# Patient Record
Sex: Female | Born: 1980 | Race: White | Hispanic: No | Marital: Married | Smoking: Never smoker
Health system: Southern US, Community
[De-identification: ages and names within clinical notes are randomized; demographics above are authoritative.]

## PROBLEM LIST (undated history)

## (undated) DIAGNOSIS — G43909 Migraine, unspecified, not intractable, without status migrainosus: Secondary | ICD-10-CM

## (undated) HISTORY — PX: OTHER SURGICAL HISTORY: SHX169

## (undated) HISTORY — PX: ABDOMINAL HYSTERECTOMY: SHX81

## (undated) HISTORY — PX: APPENDECTOMY: SHX54

## (undated) HISTORY — PX: OVARIAN CYST REMOVAL: SHX89

---

## 2008-05-04 ENCOUNTER — Emergency Department (HOSPITAL_BASED_OUTPATIENT_CLINIC_OR_DEPARTMENT_OTHER): Admission: EM | Admit: 2008-05-04 | Discharge: 2008-05-04 | Payer: Self-pay | Admitting: Emergency Medicine

## 2008-05-09 ENCOUNTER — Ambulatory Visit (HOSPITAL_BASED_OUTPATIENT_CLINIC_OR_DEPARTMENT_OTHER): Admission: RE | Admit: 2008-05-09 | Discharge: 2008-05-09 | Payer: Self-pay | Admitting: Family Medicine

## 2008-05-09 ENCOUNTER — Ambulatory Visit: Payer: Self-pay | Admitting: Diagnostic Radiology

## 2011-02-20 LAB — WET PREP, GENITAL: Trich, Wet Prep: NONE SEEN

## 2011-02-20 LAB — URINALYSIS, ROUTINE W REFLEX MICROSCOPIC
Bilirubin Urine: NEGATIVE
Hgb urine dipstick: NEGATIVE
Ketones, ur: NEGATIVE mg/dL
Specific Gravity, Urine: 1.023 (ref 1.005–1.030)
Urobilinogen, UA: 0.2 mg/dL (ref 0.0–1.0)

## 2011-02-20 LAB — PREGNANCY, URINE: Preg Test, Ur: NEGATIVE

## 2011-02-20 LAB — HCG, QUANTITATIVE, PREGNANCY: hCG, Beta Chain, Quant, S: <2 m[IU]/mL (ref <5)

## 2011-02-20 LAB — GC/CHLAMYDIA PROBE AMP, GENITAL
Chlamydia, DNA Probe: NEGATIVE
GC Probe Amp, Genital: NEGATIVE

## 2011-07-18 ENCOUNTER — Emergency Department (HOSPITAL_BASED_OUTPATIENT_CLINIC_OR_DEPARTMENT_OTHER)
Admission: EM | Admit: 2011-07-18 | Discharge: 2011-07-18 | Disposition: A | Discharge status: Home / Self Care | Payer: Commercial Managed Care - PPO | Attending: Emergency Medicine | Admitting: Emergency Medicine

## 2011-07-18 ENCOUNTER — Encounter (HOSPITAL_BASED_OUTPATIENT_CLINIC_OR_DEPARTMENT_OTHER): Payer: Self-pay | Admitting: *Deleted

## 2011-07-18 ENCOUNTER — Emergency Department (INDEPENDENT_AMBULATORY_CARE_PROVIDER_SITE_OTHER): Payer: Commercial Managed Care - PPO

## 2011-07-18 DIAGNOSIS — B9789 Other viral agents as the cause of diseases classified elsewhere: Secondary | ICD-10-CM | POA: Insufficient documentation

## 2011-07-18 DIAGNOSIS — R509 Fever, unspecified: Secondary | ICD-10-CM | POA: Insufficient documentation

## 2011-07-18 DIAGNOSIS — R1110 Vomiting, unspecified: Secondary | ICD-10-CM | POA: Insufficient documentation

## 2011-07-18 DIAGNOSIS — B349 Viral infection, unspecified: Secondary | ICD-10-CM

## 2011-07-18 DIAGNOSIS — R059 Cough, unspecified: Secondary | ICD-10-CM

## 2011-07-18 DIAGNOSIS — R05 Cough: Secondary | ICD-10-CM | POA: Insufficient documentation

## 2011-07-18 DIAGNOSIS — M791 Myalgia, unspecified site: Secondary | ICD-10-CM

## 2011-07-18 DIAGNOSIS — R197 Diarrhea, unspecified: Secondary | ICD-10-CM | POA: Insufficient documentation

## 2011-07-18 DIAGNOSIS — IMO0001 Reserved for inherently not codable concepts without codable children: Secondary | ICD-10-CM | POA: Insufficient documentation

## 2011-07-18 LAB — BASIC METABOLIC PANEL
BUN: 12 mg/dL (ref 6–23)
Creatinine, Ser: 1 mg/dL (ref 0.50–1.10)
GFR calc Af Amer: 85 mL/min — ABNORMAL LOW (ref >90)
GFR calc non Af Amer: 74 mL/min — ABNORMAL LOW (ref >90)
Potassium: 3.6 mEq/L (ref 3.5–5.1)

## 2011-07-18 LAB — URINALYSIS, ROUTINE W REFLEX MICROSCOPIC
Bilirubin Urine: NEGATIVE
Ketones, ur: NEGATIVE mg/dL
Leukocytes, UA: NEGATIVE
Nitrite: NEGATIVE
Specific Gravity, Urine: 1.018 (ref 1.005–1.030)
Urobilinogen, UA: 0.2 mg/dL (ref 0.0–1.0)

## 2011-07-18 MED ORDER — SODIUM CHLORIDE 0.9 % IV BOLUS (SEPSIS)
1000.0000 mL | Freq: Once | INTRAVENOUS | Status: AC
  Administered 2011-07-18: 1000 mL via INTRAVENOUS

## 2011-07-18 NOTE — ED Notes (Signed)
Patient transported to X-ray 

## 2011-07-18 NOTE — Discharge Instructions (Signed)
Antibiotic Nonuse  Your caregiver felt that the infection or problem was not one that would be helped with an antibiotic. Infections may be caused by viruses or bacteria. Only a caregiver can tell which one of these is the likely cause of an illness. A cold is the most common cause of infection in both adults and children. A cold is a virus. Antibiotic treatment will have no effect on a viral infection. Viruses can lead to many lost days of work caring for sick children and many missed days of school. Children may catch as many as 10 "colds" or "flus" per year during which they can be tearful, cranky, and uncomfortable. The goal of treating a virus is aimed at keeping the ill person comfortable. Antibiotics are medications used to help the body fight bacterial infections. There are relatively few types of bacteria that cause infections but there are hundreds of viruses. While both viruses and bacteria cause infection they are very different types of germs. A viral infection will typically go away by itself within 7 to 10 days. Bacterial infections may spread or get worse without antibiotic treatment. Examples of bacterial infections are:  Sore throats (like strep throat or tonsillitis).   Infection in the lung (pneumonia).   Ear and skin infections.  Examples of viral infections are:  Colds or flus.   Most coughs and bronchitis.   Sore throats not caused by Strep.   Runny noses.  It is often best not to take an antibiotic when a viral infection is the cause of the problem. Antibiotics can kill off the helpful bacteria that we have inside our body and allow harmful bacteria to start growing. Antibiotics can cause side effects such as allergies, nausea, and diarrhea without helping to improve the symptoms of the viral infection. Additionally, repeated uses of antibiotics can cause bacteria inside of our body to become resistant. That resistance can be passed onto harmful bacterial. The next time  you have an infection it may be harder to treat if antibiotics are used when they are not needed. Not treating with antibiotics allows our own immune system to develop and take care of infections more efficiently. Also, antibiotics will work better for [place] when they are prescribed for bacterial infections. Treatments for a child that is ill may include:  Give extra fluids throughout the day to stay hydrated.   Get plenty of rest.   Only give your child over-the-counter or prescription medicines for pain, discomfort, or fever as directed by your caregiver.   The use of a cool mist humidifier may help stuffy noses.   Cold medications if suggested by your caregiver.  Your caregiver may decide to start you on an antibiotic if:  The problem you were seen for today continues for a longer length of time than expected.   You develop a secondary bacterial infection.  SEEK MEDICAL CARE IF:  Fever lasts longer than 5 days.   Symptoms continue to get worse after 5 to 7 days or become severe.   Difficulty in breathing develops.   Signs of dehydration develop (poor drinking, rare urinating, dark colored urine).   Changes in behavior or worsening tiredness (listlessness or lethargy).  Document Released: 07/13/2001 Document Revised: 01/14/2011 Document Reviewed: 01/09/2009 [facility] Patient Information 2012 [place], [place].Myalgia, Adult Myalgia is the medical term for muscle pain. It is a symptom of many things. Nearly everyone at some time in their life has this. The most common cause for muscle pain is overuse or straining  and more so when you are not in shape. Injuries and muscle bruises cause myalgias. Muscle pain without a history of injury can also be caused by a virus. It frequently comes along with the flu. Myalgia not caused by muscle strain can be present in a large number of infectious diseases. Some autoimmune diseases like lupus and fibromyalgia can cause muscle pain. Myalgia may be mild,  or severe. SYMPTOMS  The symptoms of myalgia are simply muscle pain. Most of the time this is short lived and the pain goes away without treatment. DIAGNOSIS  Myalgia is diagnosed by your caregiver by taking your history. This means you tell him when the problems began, what they are, and what has been happening. If this has not been a long term problem, your caregiver may want to watch for a while to see what will happen. If it has been long term, they may want to do additional testing. TREATMENT  The treatment depends on what the underlying cause of the muscle pain is. Often anti-inflammatory medications will help. HOME CARE INSTRUCTIONS  If the pain in your muscles came from overuse, slow down your activities until the problems go away.   Myalgia from overuse of a muscle can be treated with alternating hot and cold packs on the muscle affected or with cold for the first couple days. If either heat or cold seems to make things worse, stop their use.   Apply ice to the sore area for 15 to 20 minutes, 3 to 4 times per day, while awake for the first 2 days of muscle soreness, or as directed. Put the ice in a plastic bag and place a towel between the bag of ice and your skin.   Only take over-the-counter or prescription medicines for pain, discomfort, or fever as directed by your caregiver.   Regular gentle exercise may help if you are not active.   Stretching before strenuous exercise can help lower the risk of myalgia. It is normal when beginning an exercise regimen to feel some muscle pain after exercising. Muscles that have not been used frequently will be sore at first. If the pain is extreme, this may mean injury to a muscle.  SEEK MEDICAL CARE IF:  You have an increase in muscle pain that is not relieved with medication.   You begin to run a temperature.   You develop nausea and vomiting.   You develop a stiff and painful neck.   You develop a rash.   You develop muscle pain  after a tick bite.   You have continued muscle pain while working out even after you are in good condition.  SEEK IMMEDIATE MEDICAL CARE IF: Any of your problems are getting worse and medications are not helping. MAKE SURE YOU:   Understand these instructions.   Will watch your condition.   Will get help right away if you are not doing well or get worse.  Document Released: 03/26/2006 Document Revised: 01/14/2011 Document Reviewed: 06/15/2006 [facility] Patient Information 2012 [place], [place].

## 2011-07-18 NOTE — ED Notes (Signed)
D/c home by self- reports she "feels better"- no new rx given

## 2011-07-18 NOTE — ED Notes (Signed)
Pt presents to ED today with muscle pain and cramping for the last 2 weeks.  Pt has recent dx of bronchitis.

## 2011-07-18 NOTE — ED Provider Notes (Signed)
Medical screening examination/treatment/procedure(s) were performed by non-physician practitioner and as supervising physician I was immediately available for consultation/collaboration.   Dayton Bailiff, MD 07/18/11 2229

## 2011-07-18 NOTE — ED Provider Notes (Signed)
History     CSN: [id number]  Arrival date & time 07/18/11  2015   First MD Initiated Contact with Patient 07/18/11 2038      Chief Complaint  Patient presents with  . Muscle Pain    (Consider location/radiation/quality/duration/timing/severity/associated sxs/prior treatment) HPI Comments: Pt states that she was was recently diagnosed with bronchitis and was put on zpack and then amoxicillin:pt states that she has had intermittent vomiting and diarrhea:pt states that she has also been having myalgias  Patient is a 31 y.o. female presenting with musculoskeletal pain. The history is provided by the patient. No language interpreter was used.  Muscle Pain This is a new problem. The current episode started 1 to 4 weeks ago. The problem occurs constantly. The problem has been unchanged. Associated symptoms include coughing and a fever. Pertinent negatives include no abdominal pain, numbness, rash or sore throat. The symptoms are aggravated by nothing. She has tried nothing for the symptoms.    History reviewed. No pertinent past medical history.  History reviewed. No pertinent past surgical history.  History reviewed. No pertinent family history.  History  Substance Use Topics  . Smoking status: Not on file  . Smokeless tobacco: Not on file  . Alcohol Use: Not on file    OB History    Grav Para Term Preterm Abortions TAB SAB Ect Mult Living                  Review of Systems  Constitutional: Positive for fever.  HENT: Negative for sore throat.   Respiratory: Positive for cough.   Gastrointestinal: Negative for abdominal pain.  Skin: Negative for rash.  Neurological: Negative for numbness.  All other systems reviewed and are negative.    Allergies  Compazine; Demerol; Dilaudid; and Morphine and related  Home Medications   Current Outpatient Rx  Name Route Sig Dispense Refill  . FAMOTIDINE-CA CARB-MAG HYDROX 10-800-165 MG PO CHEW Oral Chew 1 tablet by mouth 2 (two)  times daily.    Marland Kitchen LEVONORGEST-ETH ESTRAD 91-DAY 0.15-0.03 MG PO TABS Oral Take 1 tablet by mouth daily.    . ADULT MULTIVITAMIN W/MINERALS CH Oral Take 1 tablet by mouth daily.      BP 142/81  Pulse 82  Temp(Src) 98.2 F (36.8 C) (Oral)  Resp 20  Ht 5\' 8"  (1.727 m)  Wt 164 lb (74.39 kg)  BMI 24.94 kg/m2  SpO2 100%  Physical Exam  Nursing note and vitals reviewed. Constitutional: She is oriented to person, place, and time. She appears well-developed and well-nourished.  HENT:  Head: Normocephalic and atraumatic.  Right Ear: External ear normal.  Left Ear: External ear normal.  Nose: Nose normal.  Eyes: EOM are normal.  Neck: Neck supple.  Cardiovascular: Normal rate and regular rhythm.   Pulmonary/Chest: Effort normal and breath sounds normal. She exhibits no tenderness.  Abdominal: Soft. Bowel sounds are normal. There is no tenderness.  Musculoskeletal: Normal range of motion.  Neurological: She is alert and oriented to person, place, and time.  Skin: Skin is warm and dry.  Psychiatric: She has a normal mood and affect.    ED Course  Procedures (including critical care time)  Labs Reviewed  BASIC METABOLIC PANEL - Abnormal; Notable for the following:    GFR calc non Af Amer 74 (*)    GFR calc Af Amer 85 (*)    All other components within normal limits  URINALYSIS, ROUTINE W REFLEX MICROSCOPIC  PREGNANCY, URINE   Dg Chest 2  View  07/18/2011  *RADIOLOGY REPORT*  Clinical Data: Cough  CHEST - 2 VIEW  Comparison: None  Findings:  There is a scoliosis deformity involving the thoracic spine which is convex to the right.  The heart size appears normal.  No pleural effusion or pulmonary edema.  No airspace consolidation.  IMPRESSION:  1.  No active cardiopulmonary abnormalities.  Original Report Authenticated By: Rosealee Albee, M.D.     1. Myalgia   2. Viral illness       MDM  Pt feeling some what better after fluids:pt is okay to follow up with pcp as needed for  continued symptoms       Teressa Lower, NP 07/18/11 2227

## 2011-11-12 ENCOUNTER — Emergency Department (HOSPITAL_BASED_OUTPATIENT_CLINIC_OR_DEPARTMENT_OTHER): Payer: Commercial Managed Care - PPO

## 2011-11-12 ENCOUNTER — Encounter (HOSPITAL_BASED_OUTPATIENT_CLINIC_OR_DEPARTMENT_OTHER): Payer: Self-pay | Admitting: Emergency Medicine

## 2011-11-12 ENCOUNTER — Emergency Department (HOSPITAL_BASED_OUTPATIENT_CLINIC_OR_DEPARTMENT_OTHER)
Admission: EM | Admit: 2011-11-12 | Discharge: 2011-11-13 | Disposition: A | Discharge status: Home / Self Care | Payer: Commercial Managed Care - PPO | Attending: Emergency Medicine | Admitting: Emergency Medicine

## 2011-11-12 DIAGNOSIS — H538 Other visual disturbances: Secondary | ICD-10-CM | POA: Insufficient documentation

## 2011-11-12 DIAGNOSIS — R51 Headache: Secondary | ICD-10-CM | POA: Insufficient documentation

## 2011-11-12 HISTORY — DX: Migraine, unspecified, not intractable, without status migrainosus: G43.909

## 2011-11-12 NOTE — ED Provider Notes (Signed)
History     CSN: [id number]  Arrival date & time 11/12/11  2217   First MD Initiated Contact with Patient 11/12/11 2317      Chief Complaint  Patient presents with  . Headache    (Consider location/radiation/quality/duration/timing/severity/associated sxs/prior treatment) HPI This is a 31 year old white female with a two-month history of headaches. She describes the headaches as "being zapped" at various spots on her head, right greater the left. These "zaps" only last a second or 2 and resolve on their own. They're accompanied by blurred vision which also resolves when these pains resolve. The pains are not severe but have increased in frequency over the past several days. They are not accompanied by any focal neurologic deficit other than blurred vision. There is no associated nausea or vomiting. They are not like previous migraine headaches she has had. She saw her PCP about a month ago and was given a shot of Toradol which she states helped somewhat for about 2 days. She has subsequently been taking ibuprofen without relief. She has not had any imaging studies in years.  Past Medical History  Diagnosis Date  . Migraine     when she was younger    History reviewed. No pertinent past surgical history.  History reviewed. No pertinent family history.  History  Substance Use Topics  . Smoking status: Never Smoker   . Smokeless tobacco: Not on file  . Alcohol Use: No    OB History    Grav Para Term Preterm Abortions TAB SAB Ect Mult Living                  Review of Systems  All other systems reviewed and are negative.    Allergies  Compazine; Demerol; Dilaudid; and Morphine and related  Home Medications   Current Outpatient Rx  Name Route Sig Dispense Refill  . ALPRAZOLAM 0.25 MG PO TABS Oral Take 0.25 mg by mouth at bedtime as needed. Patient uses this medication for anxiety.    . ADULT MULTIVITAMIN W/MINERALS CH Oral Take 1 tablet by mouth daily.    Marland Kitchen  FAMOTIDINE-CA CARB-MAG HYDROX 10-800-165 MG PO CHEW Oral Chew 1 tablet by mouth 2 (two) times daily.    Marland Kitchen LEVONORGEST-ETH ESTRAD 91-DAY 0.15-0.03 MG PO TABS Oral Take 1 tablet by mouth daily.      BP 130/79  Pulse 71  Temp 98.6 F (37 C) (Oral)  Resp 18  SpO2 100%  LMP 10/29/2011  Physical Exam General: Well-developed, well-nourished female in no acute distress; appearance consistent with age of record HENT: normocephalic, atraumatic Eyes: pupils equal round and reactive to light; extraocular muscles intact Neck: supple Heart: regular rate and rhythm Lungs: clear to auscultation bilaterally Abdomen: soft; nondistended Extremities: No deformity; full range of motion Neurologic: Awake, alert and oriented; motor function intact in all extremities and symmetric; no facial droop; normal coordination speech; negative Romberg; normal finger to nose Skin: Warm and dry Psychiatric: Normal mood and affect    ED Course  Procedures (including critical care time)     MDM   Nursing notes and vitals signs, including pulse oximetry, reviewed.  Summary of this visit's results, reviewed by myself:   Imaging Studies: Ct Head Wo Contrast  11/12/2011  *RADIOLOGY REPORT*  Clinical Data: 31 year old female with severe headache.  CT HEAD WITHOUT CONTRAST  Technique:  Contiguous axial images were obtained from the base of the skull through the vertex without contrast.  Comparison: None.  Findings: Visualized paranasal sinuses and  mastoids are clear.  No acute osseous abnormality identified.  Visualized orbits and scalp soft tissues are within normal limits.  Cerebral volume is within normal limits for age.  No midline shift, ventriculomegaly, mass effect, evidence of mass lesion, intracranial hemorrhage or evidence of cortically based acute infarction.  Gray-white matter differentiation is within normal limits throughout the brain.  No suspicious intracranial vascular hyperdensity.  IMPRESSION:  Normal noncontrast CT appearance of the brain.  Original Report Authenticated By: Harley Hallmark, M.D.   12:20 AM Patient advised of CT findings. Patient's symptoms are atypical. Will refer to neurology for further workup         Hanley Seamen, MD 11/13/11 0020

## 2011-11-12 NOTE — ED Notes (Signed)
Pt reports feeling like she is gettting "zapped" in different parts of here head she reports sharp pain to different parts of her head, reports blurred vision at those times that immediately goes away

## 2011-11-13 NOTE — Discharge Instructions (Signed)

## 2013-06-18 IMAGING — CT CT HEAD W/O CM
1 series · 16 of 30 positions shown, 20 images · non-contrast
Comparison: None.

CLINICAL DATA: 31-year-old female with severe headache.

CT HEAD WITHOUT CONTRAST
TECHNIQUE: Contiguous axial images were obtained from the base of
the skull through the vertex without contrast.

[Series 2: head 4.8 h37s · axial · 0.45mm/px · z∈[-215,-82]mm · 16 of 32 slices shown, 20 images]
[im 2/32  brain]
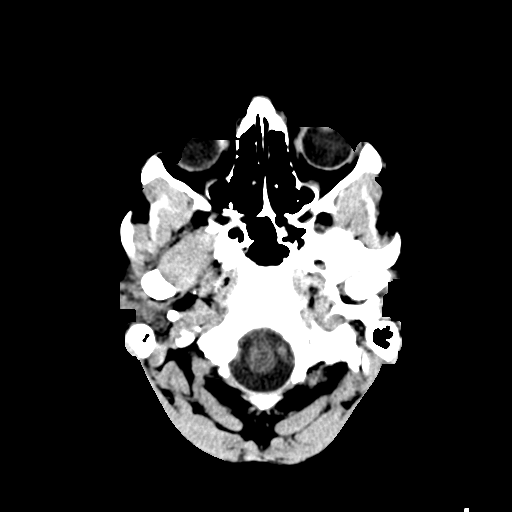
[im 2/32  bone]
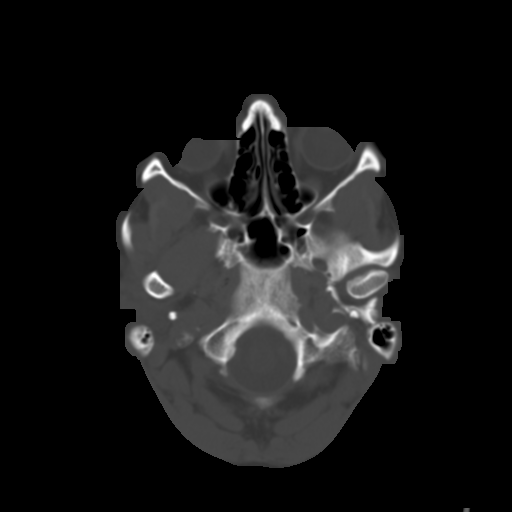
[im 4/32  brain]
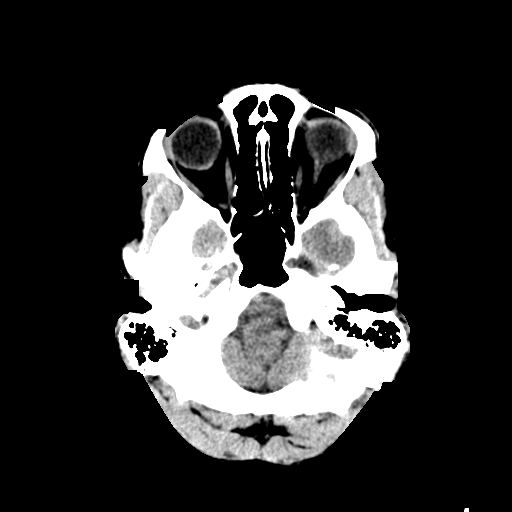
[im 6/32  brain]
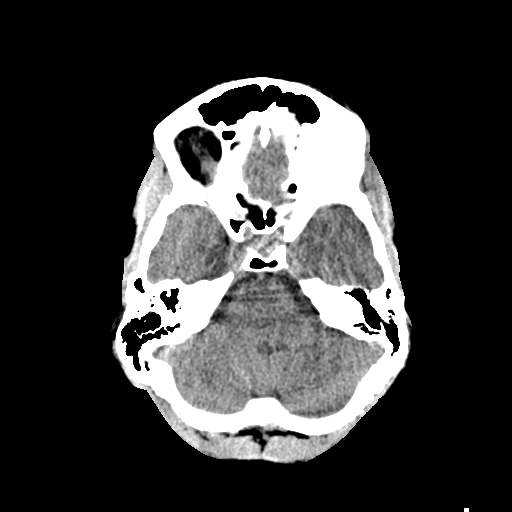
[im 8/32  brain]
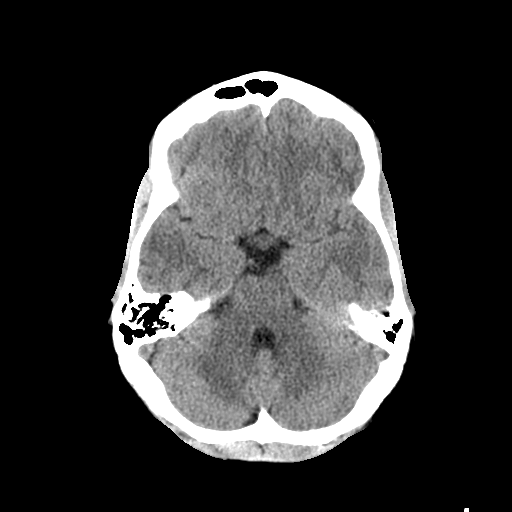
[im 9/32  brain]
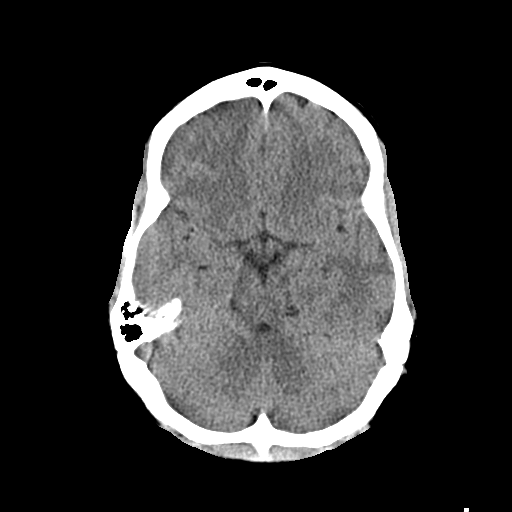
[im 9/32  bone]
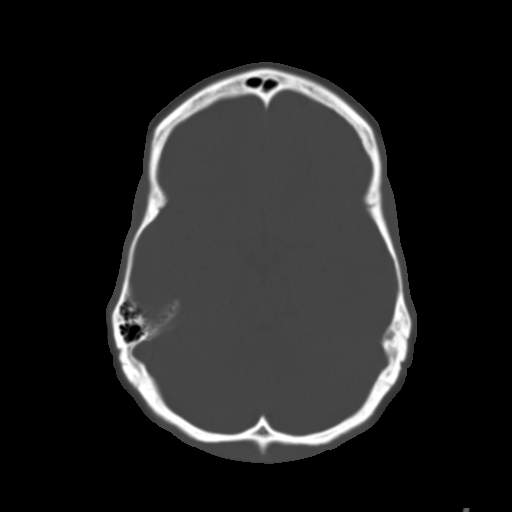
[im 11/32  brain]
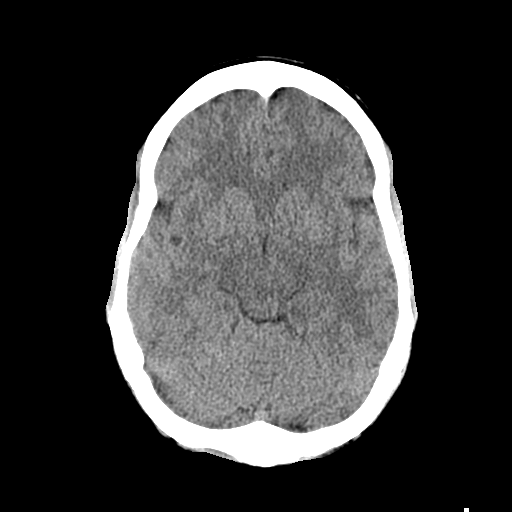
[im 13/32  brain]
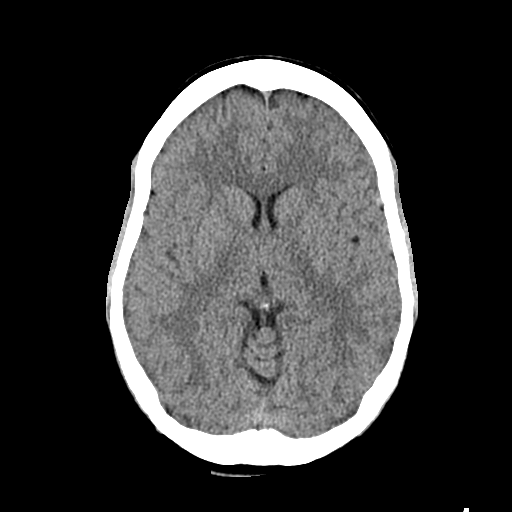
[im 15/32  brain]
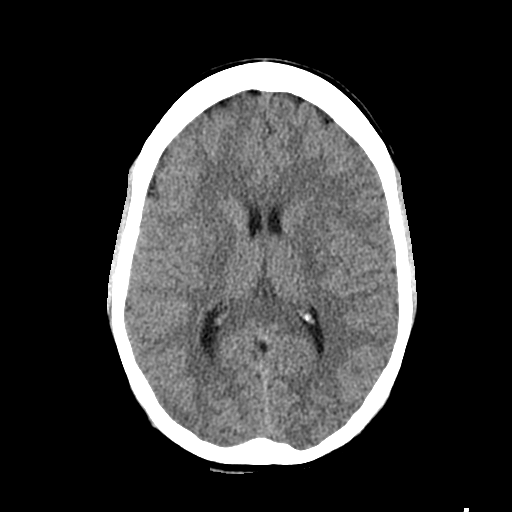
[im 17/32  brain]
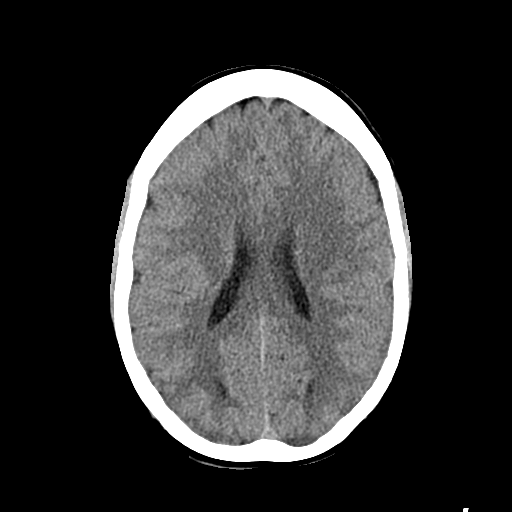
[im 17/32  bone]
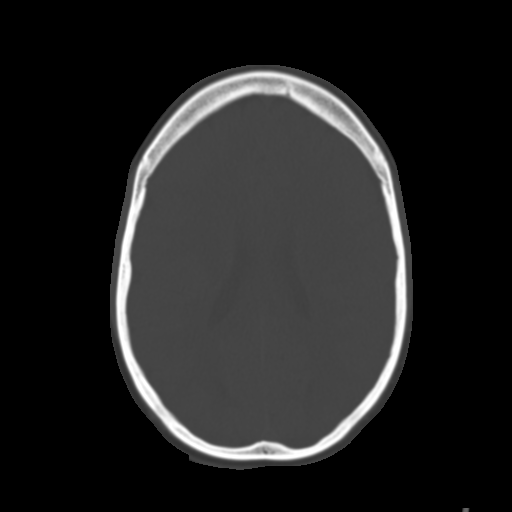
[im 19/32  brain]
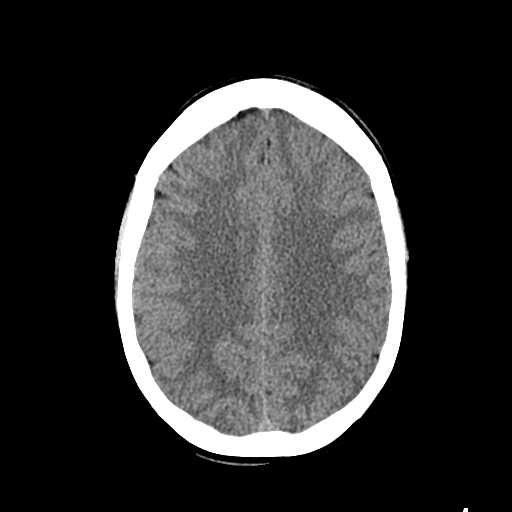
[im 21/32  brain]
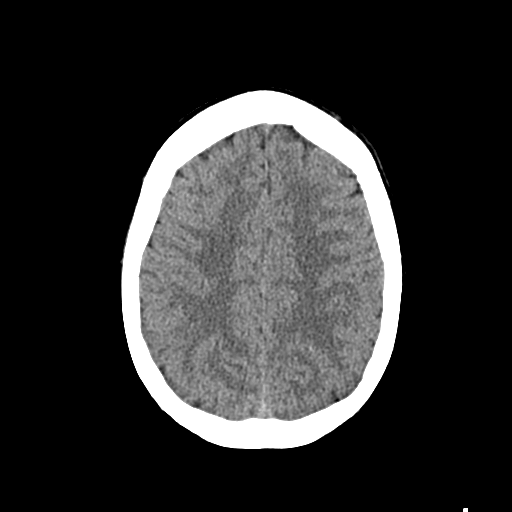
[im 23/32  brain]
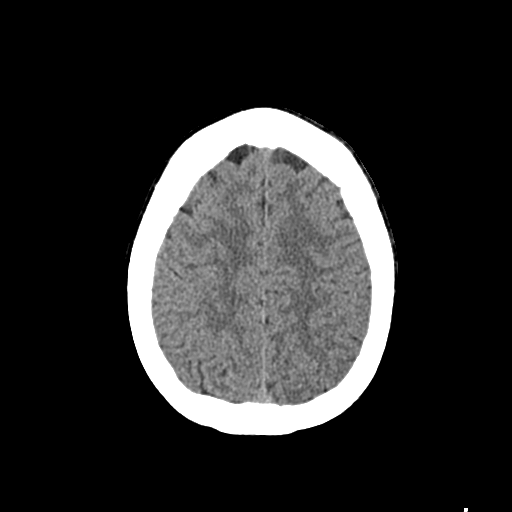
[im 24/32  brain]
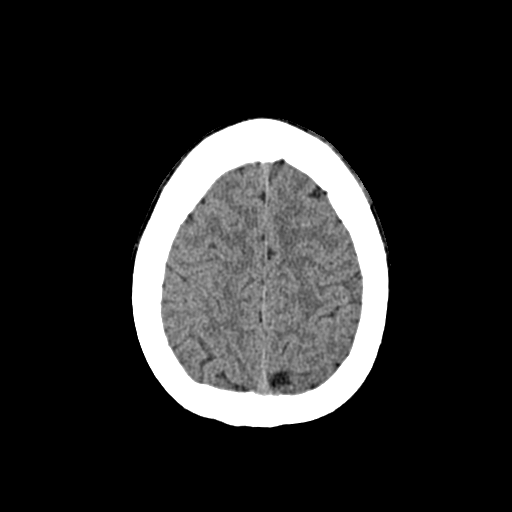
[im 24/32  bone]
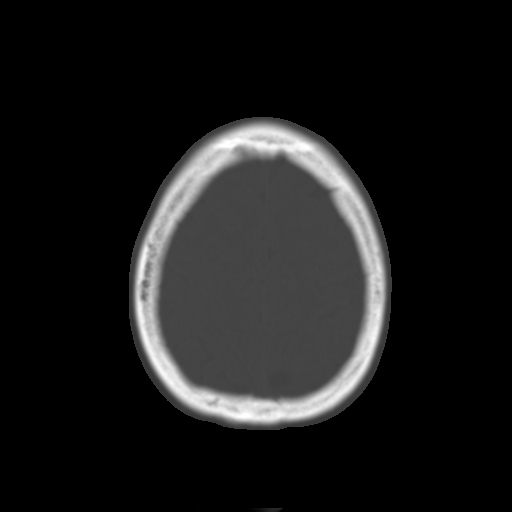
[im 26/32  brain]
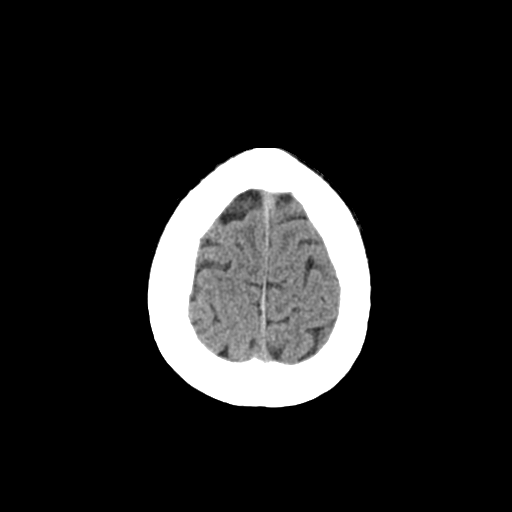
[im 28/32  brain]
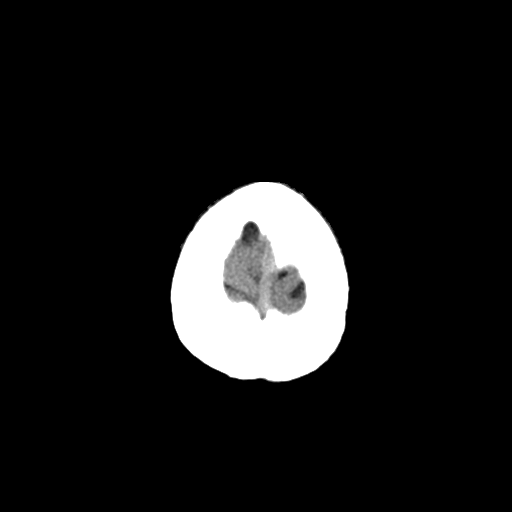
[im 30/32  brain]
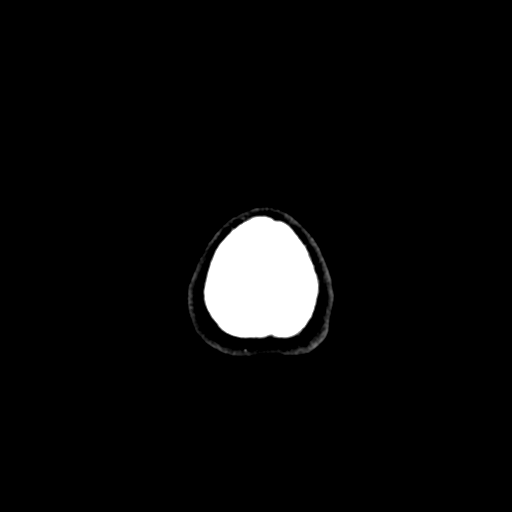

[16 of 30 positions shown; findings below may reference images not displayed]

FINDINGS: Visualized paranasal sinuses and mastoids are clear.  No
acute osseous abnormality identified.  Visualized orbits and scalp
soft tissues are within normal limits.

Cerebral volume is within normal limits for age.  No midline shift,
ventriculomegaly, mass effect, evidence of mass lesion,
intracranial hemorrhage or evidence of cortically based acute
infarction.  Gray-white matter differentiation is within normal
limits throughout the brain.  No suspicious intracranial vascular
hyperdensity.
IMPRESSION: Normal noncontrast CT appearance of the brain.

## 2019-12-26 ENCOUNTER — Other Ambulatory Visit: Payer: Self-pay

## 2019-12-26 ENCOUNTER — Encounter: Payer: Self-pay | Admitting: Allergy and Immunology

## 2019-12-26 ENCOUNTER — Ambulatory Visit: Payer: Managed Care, Other (non HMO) | Admitting: Allergy and Immunology

## 2019-12-26 VITALS — BP 110/68 | HR 86 | Temp 99.0°F | Ht 68.0 in | Wt 148.8 lb

## 2019-12-26 DIAGNOSIS — T783XXD Angioneurotic edema, subsequent encounter: Secondary | ICD-10-CM | POA: Diagnosis not present

## 2019-12-26 DIAGNOSIS — T7840XD Allergy, unspecified, subsequent encounter: Secondary | ICD-10-CM | POA: Diagnosis not present

## 2019-12-26 DIAGNOSIS — T7840XA Allergy, unspecified, initial encounter: Secondary | ICD-10-CM | POA: Insufficient documentation

## 2019-12-26 DIAGNOSIS — L500 Allergic urticaria: Secondary | ICD-10-CM

## 2019-12-26 DIAGNOSIS — T783XXA Angioneurotic edema, initial encounter: Secondary | ICD-10-CM | POA: Insufficient documentation

## 2019-12-26 MED ORDER — LEVOCETIRIZINE DIHYDROCHLORIDE 5 MG PO TABS: 5.0000 mg | ORAL_TABLET | Freq: Every day | ORAL | 5 refills | Status: DC | PRN

## 2019-12-26 MED ORDER — FAMOTIDINE 20 MG PO TABS: 20.0000 mg | ORAL_TABLET | Freq: Two times a day (BID) | ORAL | 5 refills | Status: DC | PRN

## 2019-12-26 NOTE — Progress Notes (Signed)
New Patient Note  RE: Evelyn Cervantes MRN: [id number] DOB: Oct 05, 1980 Date of Office Visit: 12/26/2019  Referring provider: Albertine Patricia, NP Primary care provider: Albertine Patricia, NP  Chief Complaint: Urticaria and Angioedema   History of present illness: Evelyn Cervantes is a 39 y.o. female seen today in consultation requested by Ermalene Searing, NP.  Over the past 6 weeks, Evelyn Cervantes has experienced recurrent episodes of hives. The hives have appeared at different times over her arms, legs, hips, torso, abdomen, and back.  The lesions are described as erythematous, raised, and pruritic.  Individual hives last less than 24 hours without leaving residual pigmentation or bruising. She has experienced associated angioedema of the lower lip on one occasion but denies concomitant cardiopulmonary or GI symptoms. She has not experienced unexpected weight loss, recurrent fevers or drenching night sweats.  The hives seem to be at their worst in the morning and tend to fade by early afternoon.  Otherwise no specific medication, food, skin care product, detergent, soap, or other environmental triggers have been identified. The symptoms do not seem to correlate with NSAIDs use or emotional stress. She did not have symptoms consistent with a respiratory tract infection at the time of symptom onset. Evelyn Cervantes has tried to control symptoms with systemic steroids and OTC antihistamines.  The hives resolved after the steroid injections, however returned a few days later.  The antihistamines of provided minimal relief.  Skin biopsy has not been performed. Recently, she has been experiencing hives a few times a week.  She does not have a history of symptoms consistent with allergic rhinoconjunctivitis, asthma, or eczema.  Assessment and plan: Recurrent urticaria Unclear etiology. Skin tests to select food and environmental allergens were negative today. NSAIDs and emotional stress commonly exacerbate  urticaria but are not the underlying etiology in this case. Physical urticarias are negative by history (i.e. pressure-induced, temperature, vibration, solar, etc.). There are no concomitant symptoms concerning for anaphylaxis. We will rule out other potential etiologies with labs. For symptom relief, patient is to take oral antihistamines as directed.  The following labs have been ordered: FCeRI antibody, anti-thyroglobulin antibody, thyroid peroxidase antibody, tryptase, H. pylori urea breath test, CBC, CMP, ESR, ANA, and alpha-gal panel.  The patient will be called with further recommendations after lab results have returned.  Instructions have been discussed and provided for H1/H2 receptor blockade with titration to find lowest effective dose.  A prescription has been provided for levocetirizine (Xyzal), 5 mg daily as needed.  A prescription has been provided for famotidine (Pepcid), 20 mg twice daily as needed.  Should there be a significant increase or change in symptoms, a journal is to be kept recording any foods eaten, beverages consumed, medications taken within a 6 hour period prior to the onset of symptoms, as well as record activities being performed, and environmental conditions. For any symptoms concerning for anaphylaxis, 911 is to be called immediately.  Angioedema Associated angioedema occurs in up to 50% of patients with chronic urticaria.  Treatment/diagnostic plan as outlined above.   Meds ordered this encounter  Medications  . levocetirizine (XYZAL) 5 MG tablet    Sig: Take 1 tablet (5 mg total) by mouth daily as needed for allergies.    Dispense:  30 tablet    Refill:  5  . famotidine (PEPCID) 20 MG tablet    Sig: Take 1 tablet (20 mg total) by mouth 2 (two) times daily as needed for heartburn or indigestion.    Dispense:  60 tablet    Refill:  5    Diagnostics: Environmental skin testing: Negative despite a positive histamine control. Food allergen skin  testing: Negative despite a positive histamine control.    Physical examination: Blood pressure 110/68, pulse 86, temperature 99 F (37.2 C), temperature source Oral, height 5' 8" (1.727 m), weight 148 lb 12.8 oz (67.5 kg), SpO2 98 %.  General: Alert, interactive, in no acute distress. HEENT: TMs pearly gray, turbinates mildly edematous without discharge, post-pharynx mildly erythematous. Neck: Supple without lymphadenopathy. Lungs: Clear to auscultation without wheezing, rhonchi or rales. CV: Normal S1, S2 without murmurs. Abdomen: Nondistended, nontender. Skin: Warm and dry, without lesions or rashes. Extremities:  No clubbing, cyanosis or edema. Neuro:   Grossly intact.  Review of systems:  Review of systems negative except as noted in HPI / PMHx or noted below: Review of Systems  Constitutional: Negative.   HENT: Negative.   Eyes: Negative.   Respiratory: Negative.   Cardiovascular: Negative.   Gastrointestinal: Negative.   Genitourinary: Negative.   Musculoskeletal: Negative.   Skin: Negative.   Neurological: Negative.   Endo/Heme/Allergies: Negative.   Psychiatric/Behavioral: Negative.     Past medical history:  Past Medical History:  Diagnosis Date  . Migraine    when she was younger    Past surgical history:  Past Surgical History:  Procedure Laterality Date  . APPENDECTOMY    . arm surgery    . OVARIAN CYST REMOVAL      Family history: Family History  Problem Relation Age of Onset  . Allergic rhinitis Neg Hx   . Angioedema Neg Hx   . Asthma Neg Hx   . Eczema Neg Hx   . Immunodeficiency Neg Hx   . Urticaria Neg Hx     Social history: Social History   Socioeconomic History  . Marital status: Married    Spouse name: Not on file  . Number of children: Not on file  . Years of education: Not on file  . Highest education level: Not on file  Occupational History  . Not on file  Tobacco Use  . Smoking status: Never Smoker  . Smokeless tobacco:  Never Used  Vaping Use  . Vaping Use: Never used  Substance and Sexual Activity  . Alcohol use: No  . Drug use: No  . Sexual activity: Not on file  Other Topics Concern  . Not on file  Social History Narrative  . Not on file   Social Determinants of Health   Financial Resource Strain:   . Difficulty of Paying Living Expenses:   Food Insecurity:   . Worried About Charity fundraiser in the Last Year:   . Arboriculturist in the Last Year:   Transportation Needs:   . Film/video editor (Medical):   Marland Kitchen Lack of Transportation (Non-Medical):   Physical Activity:   . Days of Exercise per Week:   . Minutes of Exercise per Session:   Stress:   . Feeling of Stress :   Social Connections:   . Frequency of Communication with Friends and Family:   . Frequency of Social Gatherings with Friends and Family:   . Attends Religious Services:   . Active Member of Clubs or Organizations:   . Attends Archivist Meetings:   Marland Kitchen Marital Status:   Intimate Partner Violence:   . Fear of Current or Ex-Partner:   . Emotionally Abused:   Marland Kitchen Physically Abused:   . Sexually Abused:  Environmental History: The patient lives in a 44-year-old house with carpeting throughout, gas heat, and central air.  There is no known mold/water damage in the home.  There is a dog in the home which has access to her bedroom.  She is a non-smoker.  Current Outpatient Medications  Medication Sig Dispense Refill  . ondansetron (ZOFRAN) 8 MG tablet Take 8 mg by mouth every 8 (eight) hours as needed.    . venlafaxine XR (EFFEXOR-XR) 150 MG 24 hr capsule Take 150 mg by mouth daily.    . famotidine (PEPCID) 20 MG tablet Take 1 tablet (20 mg total) by mouth 2 (two) times daily as needed for heartburn or indigestion. 60 tablet 5  . levocetirizine (XYZAL) 5 MG tablet Take 1 tablet (5 mg total) by mouth daily as needed for allergies. 30 tablet 5   No current facility-administered medications for this visit.     Known medication allergies: Allergies  Allergen Reactions  . Compazine     Tongue swelling  . Demerol Rash  . Dilaudid [Hydromorphone Hcl] Rash  . Morphine And Related Rash    I appreciate the opportunity to take part in [place] care. Please do not hesitate to contact me with questions.  Sincerely,   R. Edgar Frisk, MD

## 2019-12-26 NOTE — Patient Instructions (Addendum)
Recurrent urticaria Unclear etiology. Skin tests to select food and environmental allergens were negative today. NSAIDs and emotional stress commonly exacerbate urticaria but are not the underlying etiology in this case. Physical urticarias are negative by history (i.e. pressure-induced, temperature, vibration, solar, etc.). There are no concomitant symptoms concerning for anaphylaxis. We will rule out other potential etiologies with labs. For symptom relief, patient is to take oral antihistamines as directed.  The following labs have been ordered: FCeRI antibody, anti-thyroglobulin antibody, thyroid peroxidase antibody, tryptase, H. pylori urea breath test, CBC, CMP, ESR, ANA, and alpha-gal panel.  The patient will be called with further recommendations after lab results have returned.  Instructions have been discussed and provided for H1/H2 receptor blockade with titration to find lowest effective dose.  A prescription has been provided for levocetirizine (Xyzal), 5 mg daily as needed.  A prescription has been provided for famotidine (Pepcid), 20 mg twice daily as needed.  Should there be a significant increase or change in symptoms, a journal is to be kept recording any foods eaten, beverages consumed, medications taken within a 6 hour period prior to the onset of symptoms, as well as record activities being performed, and environmental conditions. For any symptoms concerning for anaphylaxis, 911 is to be called immediately.  Angioedema Associated angioedema occurs in up to 50% of patients with chronic urticaria.  Treatment/diagnostic plan as outlined above.   When lab results have returned you will be called with further recommendations. With the newly implemented Cures Act, the labs may be visible to you at the same time they become visible to [place]. However, the results will typically not be addressed until all of the results are back, so please be patient.  Until you have heard from [place],  please continue the treatment plan as outlined on your take home sheet.   Urticaria (Hives)  . Levocetirizine (Xyzal) 5 mg twice a day and famotidine (Pepcid) 20 mg twice a day. If no symptoms for 7-14 days then decrease to. . Levocetirizine (Xyzal) 5 mg twice a day and famotidine (Pepcid) 20 mg once a day.  If no symptoms for 7-14 days then decrease to. . Levocetirizine (Xyzal) 5 mg twice a day.  If no symptoms for 7-14 days then decrease to. . Levocetirizine (Xyzal) 5 mg once a day.  May use Benadryl (diphenhydramine) as needed for breakthrough symptoms       If symptoms return, then step up dosage

## 2019-12-26 NOTE — Assessment & Plan Note (Signed)
Unclear etiology. Skin tests to select food and environmental allergens were negative today. NSAIDs and emotional stress commonly exacerbate urticaria but are not the underlying etiology in this case. Physical urticarias are negative by history (i.e. pressure-induced, temperature, vibration, solar, etc.). There are no concomitant symptoms concerning for anaphylaxis. We will rule out other potential etiologies with labs. For symptom relief, patient is to take oral antihistamines as directed.  The following labs have been ordered: FCeRI antibody, anti-thyroglobulin antibody, thyroid peroxidase antibody, tryptase, H. pylori urea breath test, CBC, CMP, ESR, ANA, and alpha-gal panel.  The patient will be called with further recommendations after lab results have returned.  Instructions have been discussed and provided for H1/H2 receptor blockade with titration to find lowest effective dose.  A prescription has been provided for levocetirizine (Xyzal), 5 mg daily as needed.  A prescription has been provided for famotidine (Pepcid), 20 mg twice daily as needed.  Should there be a significant increase or change in symptoms, a journal is to be kept recording any foods eaten, beverages consumed, medications taken within a 6 hour period prior to the onset of symptoms, as well as record activities being performed, and environmental conditions. For any symptoms concerning for anaphylaxis, 911 is to be called immediately.

## 2019-12-26 NOTE — Assessment & Plan Note (Signed)
Associated angioedema occurs in up to 50% of patients with chronic urticaria.  Treatment/diagnostic plan as outlined above. 

## 2019-12-28 LAB — H. PYLORI BREATH TEST: H pylori Breath Test: NEGATIVE

## 2020-01-02 ENCOUNTER — Telehealth: Payer: Self-pay | Admitting: Allergy and Immunology

## 2020-01-02 NOTE — Telephone Encounter (Signed)
Informed pt we are still waiting on the alpha gal and the chronic urticaria lab results and that once we have all the results the dr will dictate the findings and we then will call her with the findings pt stated understanding

## 2020-01-02 NOTE — Telephone Encounter (Signed)
PT called to see if all lab results have come in. Advise we are still waiting for a few lab results. Pt wants to discuss the labs that has resulted.

## 2020-01-03 LAB — COMPREHENSIVE METABOLIC PANEL
ALT: 19 IU/L (ref 0–32)
AST: 20 IU/L (ref 0–40)
Albumin/Globulin Ratio: 2.3 — ABNORMAL HIGH (ref 1.2–2.2)
Albumin: 4.5 g/dL (ref 3.8–4.8)
Alkaline Phosphatase: 63 IU/L (ref 48–121)
BUN/Creatinine Ratio: 20 (ref 9–23)
BUN: 18 mg/dL (ref 6–24)
Bilirubin Total: <0.2 mg/dL (ref 0.0–1.2)
CO2: 23 mmol/L (ref 20–29)
Calcium: 9.9 mg/dL (ref 8.7–10.2)
Chloride: 101 mmol/L (ref 96–106)
Creatinine, Ser: 0.91 mg/dL (ref 0.57–1.00)
GFR calc Af Amer: 91 mL/min/{1.73_m2} (ref >59)
GFR calc non Af Amer: 79 mL/min/{1.73_m2} (ref >59)
Globulin, Total: 2 g/dL (ref 1.5–4.5)
Glucose: 92 mg/dL (ref 65–99)
Potassium: 4 mmol/L (ref 3.5–5.2)
Sodium: 140 mmol/L (ref 134–144)
Total Protein: 6.5 g/dL (ref 6.0–8.5)

## 2020-01-03 LAB — CBC
Hematocrit: 40.6 % (ref 34.0–46.6)
Hemoglobin: 13.6 g/dL (ref 11.1–15.9)
MCH: 31.2 pg (ref 26.6–33.0)
MCHC: 33.5 g/dL (ref 31.5–35.7)
MCV: 93 fL (ref 79–97)
Platelets: 271 10*3/uL (ref 150–450)
RBC: 4.36 x10E6/uL (ref 3.77–5.28)
RDW: 12.8 % (ref 11.7–15.4)
WBC: 6.7 10*3/uL (ref 3.4–10.8)

## 2020-01-03 LAB — ALPHA-GAL PANEL
Alpha Gal IgE*: <0.1 kU/L (ref <0.10)
Beef (Bos spp) IgE: <0.1 kU/L (ref <0.35)
Class Interpretation: 0
Class Interpretation: 0
Class Interpretation: 0
Lamb/Mutton (Ovis spp) IgE: <0.1 kU/L (ref <0.35)
Pork (Sus spp) IgE: <0.1 kU/L (ref <0.35)

## 2020-01-03 LAB — ANA: Anti Nuclear Antibody (ANA): NEGATIVE

## 2020-01-03 LAB — SEDIMENTATION RATE: Sed Rate: 2 mm/hr (ref 0–32)

## 2020-01-03 LAB — TRYPTASE: Tryptase: 3.4 ug/L (ref 2.2–13.2)

## 2020-01-03 LAB — THYROGLOBULIN ANTIBODY: Thyroglobulin Antibody: <1 IU/mL (ref 0.0–0.9)

## 2020-01-03 LAB — CHRONIC URTICARIA: cu index: 3 (ref <10)

## 2020-01-03 LAB — THYROID PEROXIDASE ANTIBODY: Thyroperoxidase Ab SerPl-aCnc: <8 IU/mL (ref 0–34)

## 2020-01-08 ENCOUNTER — Telehealth: Payer: Self-pay

## 2020-01-08 NOTE — Telephone Encounter (Signed)
Rosemary Holms, MD 1 hour ago (9:58 AM)  EH Good morning. Is there anything we can do testing wise to figure out the hives.  I am miserable every single day waking up with this.  I do not want to be on allergy medicine daily as it makes me feel very sleepy.  Any other suggestions or testing.  I just feel like there has to be a reason behind all this?  Do you think it could be from the covid vaccine?  Even though I received this back Jan?  Just trying to see what else it could be.  Any suggestions would be great.  Thank you so much.    Nira Retort

## 2020-01-08 NOTE — Telephone Encounter (Signed)
We have done everything we can do from a testing standpoint, environmental skin testing, food allergen skin testing, and lab work-all of which were unrevealing.  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  Chronic idiopathic urticaria can be treated with antihistamines, however another good option is Xolair.  Xolair is injected every 28 days and tends to reduce symptoms and medication requirement significantly.  Please provide the patient with information and if she would like to move forward, please submit paperwork for insurance coverage. Thanks.

## 2020-01-09 NOTE — Telephone Encounter (Signed)
L/m for patient to reach out to me to discuss Xolair

## 2020-01-09 NOTE — Telephone Encounter (Signed)
Noted and agree.  Tammy please submit paperwork. Thanks.

## 2020-01-09 NOTE — Telephone Encounter (Signed)
Pt would like to go on with xolair. I will mail her out information and have her sign the necessary paperwork to get started.

## 2020-01-17 NOTE — Telephone Encounter (Signed)
Spoke to patient and obtained PBM info and submitted for approval and will reach out to patient once approved and ready to go to [facility] for fill

## 2020-01-18 ENCOUNTER — Ambulatory Visit: Payer: Self-pay | Admitting: Allergy and Immunology

## 2020-01-19 ENCOUNTER — Telehealth: Payer: Self-pay | Admitting: *Deleted

## 2020-01-19 MED ORDER — XOLAIR 150 MG ~~LOC~~ SOLR: 300.0000 mg | SUBCUTANEOUS | 11 refills | Status: DC

## 2020-01-19 NOTE — Telephone Encounter (Signed)
L/M for patient to reach out to me so I can advise approval, copay card and submit to [facility] for Xolair.  ERX sent to [facility]

## 2020-01-26 NOTE — Telephone Encounter (Signed)
L/m for patient to call me  

## 2020-02-08 NOTE — Telephone Encounter (Signed)
Interesting. Ok. Thanks for trying.

## 2020-02-08 NOTE — Telephone Encounter (Signed)
Have reached out to patient in regards to approval and submit but no response from patient.  I will assume she has decided not to start Xolair therapy and if she decides in future she wants to start can reach out to me

## 2020-04-17 DIAGNOSIS — U071 COVID-19: Secondary | ICD-10-CM

## 2020-04-17 HISTORY — DX: COVID-19: U07.1

## 2020-07-16 HISTORY — PX: PARTIAL HYSTERECTOMY: SHX80

## 2020-12-12 ENCOUNTER — Other Ambulatory Visit: Payer: Self-pay

## 2020-12-12 ENCOUNTER — Emergency Department (INDEPENDENT_AMBULATORY_CARE_PROVIDER_SITE_OTHER)
Admission: EM | Admit: 2020-12-12 | Discharge: 2020-12-12 | Disposition: A | Discharge status: Home / Self Care | Payer: Managed Care, Other (non HMO) | Source: Home / Self Care | Attending: Family Medicine | Admitting: Family Medicine

## 2020-12-12 ENCOUNTER — Encounter: Payer: Self-pay | Admitting: Emergency Medicine

## 2020-12-12 DIAGNOSIS — R519 Headache, unspecified: Secondary | ICD-10-CM | POA: Diagnosis not present

## 2020-12-12 DIAGNOSIS — B349 Viral infection, unspecified: Secondary | ICD-10-CM | POA: Diagnosis not present

## 2020-12-12 LAB — POC SARS CORONAVIRUS 2 AG -  ED: SARS Coronavirus 2 Ag: NEGATIVE

## 2020-12-12 MED ORDER — IBUPROFEN 800 MG PO TABS
800.0000 mg | ORAL_TABLET | Freq: Once | ORAL | Status: AC
  Administered 2020-12-12: 800 mg via ORAL

## 2020-12-12 NOTE — Discharge Instructions (Addendum)
Covid test is negative Home to rest push fluids Tylenol or ibuprofen for pain, or may use Aleve Call your work infectious disease provider tomorrow Off work until Monday

## 2020-12-12 NOTE — ED Provider Notes (Signed)
[facility]    CSN: [id number] Arrival date & time: 12/12/20  1713      History   Chief Complaint Chief Complaint  Patient presents with   Covid Positive   Headache    HPI Evelyn Cervantes is a 40 y.o. female.   HPI  Pleasant 40 year old woman.  Healthy.  Works in a [employer].  Did not feel well this morning, had body aches and headaches.  Did a home COVID test that was faintly positive.  Called her employer for advice and they want her to come in for confirmatory COVID testing.  She needs this done today.  I told her that our rapid test is not a PCR but will be happy to retest her.  She still has a headache.  Feels very tired.  No coughing or chest congestion.  No runny nose or sore throat  Past Medical History:  Diagnosis Date   COVID 04/2020   Migraine    when she was younger    Patient Active Problem List   Diagnosis Date Noted   Recurrent urticaria 12/26/2019   Angioedema 12/26/2019   Allergic reaction 12/26/2019    Past Surgical History:  Procedure Laterality Date   APPENDECTOMY     arm surgery     OVARIAN CYST REMOVAL     PARTIAL HYSTERECTOMY N/A 07/2020    OB History   No obstetric history on file.      Home Medications    Prior to Admission medications   Medication Sig Start Date End Date Taking? Authorizing Provider  venlafaxine XR (EFFEXOR-XR) 150 MG 24 hr capsule Take 150 mg by mouth daily. 12/26/19   [provider]    Family History Family History  Problem Relation Age of Onset   Hyperlipidemia Mother    Hypertension Mother    Hyperlipidemia Father    Hypertension Father    Heart failure Father    Allergic rhinitis Neg Hx    Angioedema Neg Hx    Asthma Neg Hx    Eczema Neg Hx    Immunodeficiency Neg Hx    Urticaria Neg Hx     Social History Social History   Tobacco Use   Smoking status: Never   Smokeless tobacco: Never  Vaping Use   Vaping Use: Never used  Substance Use Topics   Alcohol use:  No   Drug use: No     Allergies   Compazine, Demerol, Dilaudid [hydromorphone hcl], and Morphine and related   Review of Systems Review of Systems See HPI  Physical Exam Triage Vital Signs ED Triage Vitals  Enc Vitals Group     BP 12/12/20 1737 135/87     Pulse Rate 12/12/20 1737 73     Resp 12/12/20 1737 16     Temp 12/12/20 1737 98.7 F (37.1 C)     Temp Source 12/12/20 1737 Oral     SpO2 12/12/20 1737 98 %     Weight --      Height --      Head Circumference --      Peak Flow --      Pain Score 12/12/20 1739 4     Pain Loc --      Pain Edu? --      Excl. in GC? --    No data found.  Updated Vital Signs BP 135/87 (BP Location: Right Arm)   Pulse 73   Temp 98.7 F (37.1 C) (Oral)   Resp 16  LMP 10/29/2011   SpO2 98%      Physical Exam Constitutional:      General: She is not in acute distress.    Appearance: She is well-developed and normal weight. She is ill-appearing.     Comments: Appears uncomfortable.  Appears tired  HENT:     Head: Normocephalic and atraumatic.     Mouth/Throat:     Comments: Mask remains in place Eyes:     Conjunctiva/sclera: Conjunctivae normal.     Pupils: Pupils are equal, round, and reactive to light.  Cardiovascular:     Rate and Rhythm: Normal rate.  Pulmonary:     Effort: Pulmonary effort is normal. No respiratory distress.  Abdominal:     General: There is no distension.     Palpations: Abdomen is soft.  Musculoskeletal:        General: Normal range of motion.     Cervical back: Normal range of motion.  Skin:    General: Skin is warm and dry.  Neurological:     General: No focal deficit present.     Mental Status: She is alert.  Psychiatric:        Mood and Affect: Mood normal.        Behavior: Behavior normal.     UC Treatments / Results  Labs (all labs ordered are listed, but only abnormal results are displayed) Labs Reviewed  POC SARS CORONAVIRUS 2 AG -  ED  Rapid COVID test is  negative  EKG   Radiology No results found.  Procedures Procedures (including critical care time)  Medications Ordered in UC Medications  ibuprofen (ADVIL) tablet 800 mg (800 mg Oral Given 12/12/20 1811)    Initial Impression / Assessment and Plan / UC Course  I have reviewed the triage vital signs and the nursing notes.  Pertinent labs & imaging results that were available during my care of the patient were reviewed by me and considered in my medical decision making (see chart for details).     Patient is informed of test.  Home with symptomatic care.  Call her office for additional advice.  I personally advised her to get a PCR Final Clinical Impressions(s) / UC Diagnoses   Final diagnoses:  Viral illness  Acute nonintractable headache, unspecified headache type     Discharge Instructions      Covid test is negative Home to rest push fluids Tylenol or ibuprofen for pain, or may use Aleve Call your work infectious disease provider tomorrow Off work until Monday   ED Prescriptions   None    PDMP not reviewed this encounter.   Eustace Moore, MD 12/12/20 [phone]

## 2020-12-12 NOTE — ED Triage Notes (Addendum)
COVID positive today -home test Headache since 10 am- Ibuprofen 800mg  at 1030 Nausea x 2 days  Needs a test for work per pt  Mother tested positively recently Pt was COVID positive 12/21

## 2021-06-15 ENCOUNTER — Emergency Department (HOSPITAL_BASED_OUTPATIENT_CLINIC_OR_DEPARTMENT_OTHER): Payer: BC Managed Care – PPO

## 2021-06-15 ENCOUNTER — Emergency Department (HOSPITAL_BASED_OUTPATIENT_CLINIC_OR_DEPARTMENT_OTHER)
Admission: EM | Admit: 2021-06-15 | Discharge: 2021-06-15 | Disposition: A | Discharge status: Home / Self Care | Payer: BC Managed Care – PPO | Attending: Emergency Medicine | Admitting: Emergency Medicine

## 2021-06-15 ENCOUNTER — Other Ambulatory Visit: Payer: Self-pay

## 2021-06-15 ENCOUNTER — Encounter (HOSPITAL_BASED_OUTPATIENT_CLINIC_OR_DEPARTMENT_OTHER): Payer: Self-pay | Admitting: Emergency Medicine

## 2021-06-15 DIAGNOSIS — R1011 Right upper quadrant pain: Secondary | ICD-10-CM | POA: Insufficient documentation

## 2021-06-15 DIAGNOSIS — R1013 Epigastric pain: Secondary | ICD-10-CM | POA: Insufficient documentation

## 2021-06-15 DIAGNOSIS — R110 Nausea: Secondary | ICD-10-CM | POA: Insufficient documentation

## 2021-06-15 LAB — CBC WITH DIFFERENTIAL/PLATELET
Abs Immature Granulocytes: 0.03 10*3/uL (ref 0.00–0.07)
Basophils Absolute: 0.1 10*3/uL (ref 0.0–0.1)
Basophils Relative: 1 %
Eosinophils Absolute: 0.2 10*3/uL (ref 0.0–0.5)
Eosinophils Relative: 2 %
HCT: 40.2 % (ref 36.0–46.0)
Hemoglobin: 13.7 g/dL (ref 12.0–15.0)
Immature Granulocytes: 0 %
Lymphocytes Relative: 23 %
Lymphs Abs: 2.4 10*3/uL (ref 0.7–4.0)
MCH: 31.1 pg (ref 26.0–34.0)
MCHC: 34.1 g/dL (ref 30.0–36.0)
MCV: 91.4 fL (ref 80.0–100.0)
Monocytes Absolute: 0.6 10*3/uL (ref 0.1–1.0)
Monocytes Relative: 5 %
Neutro Abs: 7.5 10*3/uL (ref 1.7–7.7)
Neutrophils Relative %: 69 %
Platelets: 318 10*3/uL (ref 150–400)
RBC: 4.4 MIL/uL (ref 3.87–5.11)
RDW: 12.4 % (ref 11.5–15.5)
WBC: 10.8 10*3/uL — ABNORMAL HIGH (ref 4.0–10.5)
nRBC: 0 % (ref 0.0–0.2)

## 2021-06-15 LAB — COMPREHENSIVE METABOLIC PANEL
ALT: 21 U/L (ref 0–44)
AST: 30 U/L (ref 15–41)
Albumin: 4.3 g/dL (ref 3.5–5.0)
Alkaline Phosphatase: 47 U/L (ref 38–126)
Anion gap: 7 (ref 5–15)
BUN: 17 mg/dL (ref 6–20)
CO2: 28 mmol/L (ref 22–32)
Calcium: 9.3 mg/dL (ref 8.9–10.3)
Chloride: 103 mmol/L (ref 98–111)
Creatinine, Ser: 0.83 mg/dL (ref 0.44–1.00)
GFR, Estimated: >60 mL/min (ref >60)
Glucose, Bld: 91 mg/dL (ref 70–99)
Potassium: 3.7 mmol/L (ref 3.5–5.1)
Sodium: 138 mmol/L (ref 135–145)
Total Bilirubin: 0.6 mg/dL (ref 0.3–1.2)
Total Protein: 6.9 g/dL (ref 6.5–8.1)

## 2021-06-15 LAB — LIPASE, BLOOD: Lipase: 31 U/L (ref 11–51)

## 2021-06-15 MED ORDER — FENTANYL CITRATE PF 50 MCG/ML IJ SOSY
100.0000 ug | PREFILLED_SYRINGE | Freq: Once | INTRAMUSCULAR | Status: AC
  Administered 2021-06-15: 100 ug via INTRAVENOUS
  Filled 2021-06-15: qty 2

## 2021-06-15 MED ORDER — ONDANSETRON HCL 4 MG/2ML IJ SOLN
4.0000 mg | Freq: Once | INTRAMUSCULAR | Status: AC
  Administered 2021-06-15: 4 mg via INTRAVENOUS
  Filled 2021-06-15: qty 2

## 2021-06-15 MED ORDER — SODIUM CHLORIDE 0.9 % IV BOLUS
1000.0000 mL | Freq: Once | INTRAVENOUS | Status: AC
  Administered 2021-06-15: 1000 mL via INTRAVENOUS

## 2021-06-15 MED ORDER — IOHEXOL 300 MG/ML  SOLN
100.0000 mL | Freq: Once | INTRAMUSCULAR | Status: AC | PRN
  Administered 2021-06-15: 100 mL via INTRAVENOUS

## 2021-06-15 MED ORDER — ONDANSETRON HCL 4 MG PO TABS: 4.0000 mg | ORAL_TABLET | Freq: Four times a day (QID) | ORAL | 0 refills | Status: AC

## 2021-06-15 MED ORDER — FENTANYL CITRATE PF 50 MCG/ML IJ SOSY
50.0000 ug | PREFILLED_SYRINGE | Freq: Once | INTRAMUSCULAR | Status: AC
  Administered 2021-06-15: 50 ug via INTRAVENOUS
  Filled 2021-06-15: qty 1

## 2021-06-15 NOTE — ED Triage Notes (Signed)
Pt arrives pov, ambulatory to triage with c/o RUQ pain x 3 hrs. Pt endorses nausea, denies constipation or diarrhea, denies fever

## 2021-06-15 NOTE — ED Provider Notes (Signed)
[facility] Provider Note   CSN: [id number] Arrival date & time: 06/15/21  1408     History  Chief Complaint  Patient presents with   Abdominal Pain    Evelyn Cervantes is a 41 y.o. female. With no significant past medical history who presents to the emergency department with abdominal pain.   States that beginning 3-4 hours ago she began having "aching and gnawing pain" in her RUQ. Pain is worse with movement. She states it has become progressively worse since onset. She has associated nausea without vomiting. Has taken tylenol and ibuprofen without relief of symptoms. States she has had similar pain intermittently over the past month but not as severe. She denies fever, diarrhea, urinary symptoms    Abdominal Pain Associated symptoms: nausea   Associated symptoms: no chest pain, no diarrhea, no dysuria, no fever, no shortness of breath and no vomiting       Home Medications Prior to Admission medications   Medication Sig Start Date End Date Taking? Authorizing Provider  venlafaxine XR (EFFEXOR-XR) 150 MG 24 hr capsule Take 150 mg by mouth daily. 12/26/19   [provider]      Allergies    Compazine, Demerol, Dilaudid [hydromorphone hcl], and Morphine and related    Review of Systems   Review of Systems  Constitutional:  Negative for fever.  Respiratory:  Negative for shortness of breath.   Cardiovascular:  Negative for chest pain.  Gastrointestinal:  Positive for abdominal pain and nausea. Negative for diarrhea and vomiting.  Genitourinary:  Negative for dysuria and flank pain.  All other systems reviewed and are negative.  Physical Exam Updated Vital Signs BP 122/84 (BP Location: Left Arm)    Pulse 92    Temp 98.2 F (36.8 C) (Oral)    Resp 16    Ht 5\' 8"  (1.727 m)    Wt 76.2 kg    LMP 10/29/2011    SpO2 100%    BMI 25.54 kg/m  Physical Exam Vitals and nursing note reviewed.  Constitutional:      General: She is not in  acute distress.    Appearance: Normal appearance. She is well-developed and normal weight. She is not ill-appearing or toxic-appearing.  HENT:     Head: Normocephalic.     Mouth/Throat:     Mouth: Mucous membranes are moist.     Pharynx: Oropharynx is clear.  Eyes:     General: No scleral icterus.    Extraocular Movements: Extraocular movements intact.     Pupils: Pupils are equal, round, and reactive to light.  Cardiovascular:     Rate and Rhythm: Normal rate and regular rhythm.     Heart sounds: Normal heart sounds. No murmur heard. Pulmonary:     Effort: Pulmonary effort is normal. No respiratory distress.     Breath sounds: Normal breath sounds.  Abdominal:     General: Bowel sounds are normal. There is no distension. There are no signs of injury.     Palpations: Abdomen is soft. There is no hepatomegaly.     Tenderness: There is abdominal tenderness in the right upper quadrant and epigastric area. There is guarding. There is no right CVA tenderness, left CVA tenderness or rebound. Positive signs include Murphy's sign. Negative signs include McBurney's sign.     Hernia: No hernia is present.  Musculoskeletal:        General: Normal range of motion.     Cervical back: Normal range of motion and  neck supple.  Skin:    General: Skin is warm and dry.     Capillary Refill: Capillary refill takes less than 2 seconds.     Coloration: Skin is not jaundiced.  Neurological:     General: No focal deficit present.     Mental Status: She is alert and oriented to person, place, and time. Mental status is at baseline.  Psychiatric:        Mood and Affect: Mood normal.        Behavior: Behavior normal.        Thought Content: Thought content normal.        Judgment: Judgment normal.    ED Results / Procedures / Treatments   Labs (all labs ordered are listed, but only abnormal results are displayed) Labs Reviewed  CBC WITH DIFFERENTIAL/PLATELET - Abnormal; Notable for the following  components:      Result Value   WBC 10.8 (*)    All other components within normal limits  COMPREHENSIVE METABOLIC PANEL  LIPASE, BLOOD   EKG None  Radiology CT ABDOMEN PELVIS W CONTRAST  Result Date: 06/15/2021 CLINICAL DATA:  Acute right upper quadrant pain and nausea for past 3 hours. EXAM: CT ABDOMEN AND PELVIS WITH CONTRAST TECHNIQUE: Multidetector CT imaging of the abdomen and pelvis was performed using the standard protocol following bolus administration of intravenous contrast. RADIATION DOSE REDUCTION: This exam was performed according to the departmental dose-optimization program which includes automated exposure control, adjustment of the mA and/or kV according to patient size and/or use of iterative reconstruction technique. CONTRAST:  165mL OMNIPAQUE IOHEXOL 300 MG/ML  SOLN COMPARISON:  05/09/2008 FINDINGS: Lower Chest: No acute findings. Hepatobiliary: Sub-cm low-attenuation lesion in the right hepatic lobe is too small to characterize but most likely represents a tiny cyst. No other liver lesions identified. Gallbladder is unremarkable. No evidence of biliary ductal dilatation. Pancreas:  No mass or inflammatory changes. Spleen: Within normal limits in size and appearance. Adrenals/Urinary Tract: No masses identified. No evidence of ureteral calculi or hydronephrosis. Stomach/Bowel: No evidence of hiatal hernia. No evidence of obstruction, inflammatory process or abnormal fluid collections. Vascular/Lymphatic: No pathologically enlarged lymph nodes. No acute vascular findings. Reproductive: Prior hysterectomy. A simple cyst is seen in the left adnexa measuring 3.1 x 2.9 cm, most likely a physiologic ovarian cyst in a reproductive age female. No evidence of inflammatory process or abnormal fluid collections. Other:  None. Musculoskeletal:  No suspicious bone lesions identified. IMPRESSION: 3.1 cm benign-appearing left adnexal cyst, most likely a physiologic ovarian cyst in a reproductive  age female. No follow-up imaging recommended. Note: This recommendation does not apply to premenarchal patients and to those with increased risk (genetic, family history, elevated tumor markers or other high-risk factors) of ovarian cancer. Reference: JACR 2020 Feb; 17(2):248-254 No other significant abnormality in abdomen or pelvis. Electronically Signed   By: Marlaine Hind M.D.   On: 06/15/2021 17:15   US Abdomen Limited RUQ (LIVER/GB)  Result Date: 06/15/2021 CLINICAL DATA:  Right upper quadrant abdominal pain. EXAM: ULTRASOUND ABDOMEN LIMITED RIGHT UPPER QUADRANT COMPARISON:  None. FINDINGS: Gallbladder: Gallbladder mildly distended. No wall thickening, stones or pericholecystic fluid. Common bile duct: Diameter: 3 mm Liver: No focal lesion identified. Within normal limits in parenchymal echogenicity. Portal vein is patent on color Doppler imaging with normal direction of blood flow towards the liver. Other: None. IMPRESSION: Normal right upper quadrant ultrasound. Electronically Signed   By: Lajean Manes M.D.   On: 06/15/2021 15:51  Procedures Procedures    Medications Ordered in ED Medications  sodium chloride 0.9 % bolus 1,000 mL (0 mLs Intravenous Stopped 06/15/21 1644)  fentaNYL (SUBLIMAZE) injection 50 mcg (50 mcg Intravenous Given 06/15/21 1509)  ondansetron (ZOFRAN) injection 4 mg (4 mg Intravenous Given 06/15/21 1536)  fentaNYL (SUBLIMAZE) injection 100 mcg (100 mcg Intravenous Given 06/15/21 1657)  iohexol (OMNIPAQUE) 300 MG/ML solution 100 mL (100 mLs Intravenous Contrast Given 06/15/21 1643)    ED Course/ Medical Decision Making/ A&P                           Medical Decision Making Amount and/or Complexity of Data Reviewed Labs: ordered. Radiology: ordered.  Risk Prescription drug management.  Patient presents to the ED with complaints of abdominal pain. This involves an extensive number of treatment options, and is a complaint that carries with it a high risk of  complications and morbidity.   Additional history obtained:  Additional history obtained from: External records from outside source obtained and reviewed including: Previous primary care visits  Cardiac Monitoring: The patient was maintained on a cardiac monitor.  I personally viewed and interpreted the cardiac monitored which showed an underlying rhythm of: Sinus rhythm  Lab Results: I Ordered, reviewed, and interpreted labs. Pertinent results include: CBC with white blood cells 10.8 CMP within normal limits Lipase 31, negative  Imaging Studies ordered:  I ordered imaging studies which included right upper quadrant ultrasound, CT abdomen pelvis.  I independently reviewed & interpreted imaging & am in agreement with radiology impression. Imaging shows: Right upper quadrant ultrasound was normal findings CT abdomen pelvis with contrast shows 3.1 cm benign appearing left adnexal cyst, the most likely physiologic.  Otherwise no acute abnormalities.  Medications  I ordered medication including IV fluids, fentanyl, Zofran for pain, nausea Reevaluation of the patient after medication shows that patient improved  Tests Considered: Chest x-ray  ED Course: 41 year old female who presents emergency department right upper quadrant abdominal pain.  Pain consistent with biliary colic.  Her abdominal exam without peritoneal signs.  There is no evidence of an acute abdomen at this time.  She is overall well-appearing and nontoxic in appearance.  Tender right upper quadrant ultrasound finding patient likely has biliary colic, no signs of acute cholecystitis or cholangitis.  Symptoms less likely to represent acute pancreatitis as lipase is negative, peptic ulcer disease, acute infectious process such as pneumonia, hepatitis, pyelonephritis, atypical appendicitis (previous appendectomy), vascular catastrophe, bowel obstruction, viscus perforation, ACS.  Presentation is not as is with other acute,  emergent causes of abdominal pain this time. Given fluids, fentanyl, Zofran with almost resolution of pain.  Patient is no longer guarding.  She feels improved from presentation. Discussed finding with patient.  Discussed that she should have follow-up with gastroenterology.  She verbalized understanding.  Instructed return to the emergency department for any worsening abdominal pain, with intractable nausea and vomiting, fevers.  She verbalized understanding.  I have answered all of her questions at the bedside.  We will have her follow-up with Dr. Watt Climes, gastroenterology.  We will provide her with prescription for Zofran.   Dispostion:  After consideration of the diagnostic results and the patients response to treatment, I feel that the patent would benefit from discharge.  Consider admission for intractable nausea and vomiting however nausea vomiting controlled here.  Patient tolerated p.o. trial. The patient has been appropriately medically screened and/or stabilized in the ED. I have low suspicion for any  other emergent medical condition which would require further screening, evaluation or treatment in the ED or require inpatient management  Final Clinical Impression(s) / ED Diagnoses Final diagnoses:  RUQ abdominal pain    Rx / DC Orders ED Discharge Orders          Ordered    ondansetron (ZOFRAN) 4 MG tablet  Every 6 hours        06/15/21 1810              Mickie Hillier, PA-C 06/16/21 1506    Lajean Saver, MD 06/16/21 1511

## 2021-06-15 NOTE — Discharge Instructions (Addendum)
He was seen in the emergency department today for abdominal pain and nausea.  We did imaging which was overall reassuring.  Your lab work was also reassuring.  You may have something called biliary colic which are episodes of right upper quadrant pain related to your gallbladder.  I am referring you to a gastroenterologist to have follow-up.  Please return if you can have fever with the abdominal pain, nausea or vomiting despite taking Zofran and inability to tolerate liquids.

## 2022-12-28 ENCOUNTER — Encounter (HOSPITAL_BASED_OUTPATIENT_CLINIC_OR_DEPARTMENT_OTHER): Payer: Self-pay | Admitting: Emergency Medicine

## 2022-12-28 ENCOUNTER — Emergency Department (HOSPITAL_BASED_OUTPATIENT_CLINIC_OR_DEPARTMENT_OTHER): Payer: 59

## 2022-12-28 ENCOUNTER — Other Ambulatory Visit: Payer: Self-pay

## 2022-12-28 ENCOUNTER — Emergency Department (HOSPITAL_BASED_OUTPATIENT_CLINIC_OR_DEPARTMENT_OTHER)
Admission: EM | Admit: 2022-12-28 | Discharge: 2022-12-28 | Disposition: A | Discharge status: Home / Self Care | Payer: 59

## 2022-12-28 DIAGNOSIS — R0602 Shortness of breath: Secondary | ICD-10-CM | POA: Insufficient documentation

## 2022-12-28 DIAGNOSIS — Z20822 Contact with and (suspected) exposure to covid-19: Secondary | ICD-10-CM | POA: Diagnosis not present

## 2022-12-28 DIAGNOSIS — R000 Tachycardia, unspecified: Secondary | ICD-10-CM | POA: Insufficient documentation

## 2022-12-28 DIAGNOSIS — R0789 Other chest pain: Secondary | ICD-10-CM | POA: Diagnosis present

## 2022-12-28 LAB — COMPREHENSIVE METABOLIC PANEL
ALT: 19 U/L (ref 0–44)
AST: 31 U/L (ref 15–41)
Albumin: 4.3 g/dL (ref 3.5–5.0)
Alkaline Phosphatase: 50 U/L (ref 38–126)
Anion gap: 10 (ref 5–15)
BUN: 14 mg/dL (ref 6–20)
CO2: 23 mmol/L (ref 22–32)
Calcium: 9.4 mg/dL (ref 8.9–10.3)
Chloride: 105 mmol/L (ref 98–111)
Creatinine, Ser: 0.89 mg/dL (ref 0.44–1.00)
GFR, Estimated: >60 mL/min (ref >60)
Glucose, Bld: 92 mg/dL (ref 70–99)
Potassium: 3.8 mmol/L (ref 3.5–5.1)
Sodium: 138 mmol/L (ref 135–145)
Total Bilirubin: 0.6 mg/dL (ref 0.3–1.2)
Total Protein: 7.1 g/dL (ref 6.5–8.1)

## 2022-12-28 LAB — CBC
HCT: 40.8 % (ref 36.0–46.0)
Hemoglobin: 13.8 g/dL (ref 12.0–15.0)
MCH: 30.2 pg (ref 26.0–34.0)
MCHC: 33.8 g/dL (ref 30.0–36.0)
MCV: 89.3 fL (ref 80.0–100.0)
Platelets: 300 10*3/uL (ref 150–400)
RBC: 4.57 MIL/uL (ref 3.87–5.11)
RDW: 12.1 % (ref 11.5–15.5)
WBC: 7.1 10*3/uL (ref 4.0–10.5)
nRBC: 0 % (ref 0.0–0.2)

## 2022-12-28 LAB — RESP PANEL BY RT-PCR (RSV, FLU A&B, COVID)  RVPGX2
Influenza A by PCR: NEGATIVE
Influenza B by PCR: NEGATIVE
Resp Syncytial Virus by PCR: NEGATIVE
SARS Coronavirus 2 by RT PCR: NEGATIVE

## 2022-12-28 LAB — TROPONIN I (HIGH SENSITIVITY)
Troponin I (High Sensitivity): 2 ng/L (ref <18)
Troponin I (High Sensitivity): <2 ng/L (ref <18)

## 2022-12-28 LAB — LIPASE, BLOOD: Lipase: 22 U/L (ref 11–51)

## 2022-12-28 LAB — D-DIMER, QUANTITATIVE: D-Dimer, Quant: 0.43 ug/mL-FEU (ref 0.00–0.50)

## 2022-12-28 LAB — PREGNANCY, URINE: Preg Test, Ur: NEGATIVE

## 2022-12-28 MED ORDER — KETOROLAC TROMETHAMINE 15 MG/ML IJ SOLN
15.0000 mg | Freq: Once | INTRAMUSCULAR | Status: AC
  Administered 2022-12-28: 15 mg via INTRAVENOUS
  Filled 2022-12-28: qty 1

## 2022-12-28 MED ORDER — FAMOTIDINE 20 MG PO TABS
20.0000 mg | ORAL_TABLET | Freq: Once | ORAL | Status: AC
  Administered 2022-12-28: 20 mg via ORAL
  Filled 2022-12-28: qty 1

## 2022-12-28 MED ORDER — ASPIRIN 325 MG PO TABS
325.0000 mg | ORAL_TABLET | Freq: Every day | ORAL | Status: DC
  Administered 2022-12-28: 325 mg via ORAL
  Filled 2022-12-28: qty 1

## 2022-12-28 MED ORDER — ALUM & MAG HYDROXIDE-SIMETH 200-200-20 MG/5ML PO SUSP
30.0000 mL | Freq: Once | ORAL | Status: AC
  Administered 2022-12-28: 30 mL via ORAL
  Filled 2022-12-28: qty 30

## 2022-12-28 NOTE — ED Notes (Signed)
EDP at [facility] 

## 2022-12-28 NOTE — Discharge Instructions (Signed)
Please follow-up with your primary doctor soon as possible.  Turn immediately follow-up fevers, chills, worsening chest pain, shortness of breath, lightheadedness, passout, nausea, vomiting or any new or worsening symptoms that are concerning to you.

## 2022-12-28 NOTE — ED Triage Notes (Signed)
Mid chest pain x 3 days , sharp throat pain , squeezing pain , radiates to right arm . Abd pain to back pain she said .  Denies cough yet some shortness of breath. No Hx HTN Alert and oriented x 4 .

## 2022-12-28 NOTE — ED Provider Notes (Signed)
[place] [facility] DEPART[facility] AT [facility] Provider Note   CSN: [id number] Arrival date & time: 12/28/22  0746     History  Chief Complaint  Patient presents with   Chest Pain    Evelyn Cervantes is a 42 y.o. female.  42 year old female present emergency department for chest pain.  Symptoms started Friday.  Was intermittent.  Last night became constant.  Reports it is a squeezing sensation in center of chest.  Some radiation towards right arm.  Some mild shortness of breath as well.  Took some Tylenol Motrin seemingly helped somewhat last night.  Denies any fevers, chills, she did endorse some epigastric discomfort.  No nausea or vomiting.   Chest Pain      Home Medications Prior to Admission medications   Medication Sig Start Date End Date Taking? Authorizing Provider  ondansetron (ZOFRAN) 4 MG tablet Take 1 tablet (4 mg total) by mouth every 6 (six) hours. 06/15/21   Cristopher Peru, PA-C  venlafaxine XR (EFFEXOR-XR) 150 MG 24 hr capsule Take 150 mg by mouth daily. 12/26/19   [provider]      Allergies    Compazine, Demerol, Dilaudid [hydromorphone hcl], and Morphine and codeine    Review of Systems   Review of Systems  Cardiovascular:  Positive for chest pain.    Physical Exam Updated Vital Signs BP 109/63   Pulse 77   Temp 98.6 F (37 C)   Resp 16   Wt 85.3 kg   LMP 10/29/2011   SpO2 100%   BMI 28.59 kg/m  Physical Exam Vitals and nursing note reviewed.  Constitutional:      General: She is not in acute distress.    Appearance: She is not toxic-appearing.  HENT:     Head: Normocephalic.  Cardiovascular:     Rate and Rhythm: Normal rate and regular rhythm.     Heart sounds: Normal heart sounds.  Pulmonary:     Effort: Pulmonary effort is normal.     Breath sounds: Normal breath sounds.  Chest:     Chest wall: No tenderness.  Musculoskeletal:     Right lower leg: No edema.     Left lower leg: No edema.  Skin:     General: Skin is warm.     Capillary Refill: Capillary refill takes less than 2 seconds.  Neurological:     General: No focal deficit present.     Mental Status: She is alert.  Psychiatric:        Mood and Affect: Mood normal.        Behavior: Behavior normal.     ED Results / Procedures / Treatments   Labs (all labs ordered are listed, but only abnormal results are displayed) Labs Reviewed  RESP PANEL BY RT-PCR (RSV, FLU A&B, COVID)  [id number]  CBC  COMPREHENSIVE METABOLIC PANEL  LIPASE, BLOOD  PREGNANCY, URINE  D-DIMER, QUANTITATIVE  TROPONIN I (HIGH SENSITIVITY)  TROPONIN I (HIGH SENSITIVITY)    EKG EKG Interpretation Date/Time:  Monday December 28 2022 08:04:14 EDT Ventricular Rate:  111 PR Interval:  122 QRS Duration:  131 QT Interval:  347 QTC Calculation: 472 R Axis:   86  Text Interpretation: Sinus tachycardia Probable left atrial enlargement Right bundle branch block Confirmed by Estanislado Pandy [phone]) on 12/28/2022 9:27:54 AM  Radiology DG Chest 2 View  Result Date: 12/28/2022 CLINICAL DATA:  Chest pain for 3 days EXAM: CHEST - 2 VIEW COMPARISON:  08/01/2013 FINDINGS: The heart size  and mediastinal contours are within normal limits. Both lungs are clear. Dextroscoliosis of the thoracic spine. IMPRESSION: No acute abnormality of the lungs. Electronically Signed   By: Jearld Lesch M.D.   On: 12/28/2022 08:38    Procedures Procedures    Medications Ordered in ED Medications  aspirin tablet 325 mg (325 mg Oral Given 12/28/22 0822)  alum & mag hydroxide-simeth (MAALOX/MYLANTA) 200-200-20 MG/5ML suspension 30 mL (30 mLs Oral Given 12/28/22 0837)  famotidine (PEPCID) tablet 20 mg (20 mg Oral Given 12/28/22 0823)  ketorolac (TORADOL) 15 MG/ML injection 15 mg (15 mg Intravenous Given 12/28/22 1010)    ED Course/ Medical Decision Making/ A&P Clinical Course as of 12/28/22 1059  Mon Dec 28, 2022  4098 D-Dimer, Quant: 0.43 PE less likely. [TY]  0837 CBC No leukocytosis  to suggest stomach infection.  No anemia. [TY]  1053 Patient reevaluated.  She is feeling better after medications.  Workup today overwhelmingly reassuring.  Heart rate improved is in the 80s.  She is maintaining oxygen saturation on room air.  She has no fever tachycardia or leukocytosis to suggest systemic infection.  Her troponin is undetectable, symptoms have been ongoing for the past 24 hours; ACS unlikely.  EKG without ST segment changes to indicate ischemia, does have right bundle branch block, but per patient this is normal for her.  CMP with no significant metabolic derangements.  No transaminitis to suggest hepatobiliary disease.  Lipase is negative; pancreatitis is unlikely.  Unclear etiology for her symptoms GERD versus peptic ulcer disease versus musculoskeletal versus esophageal spasm.  However patient feeling somewhat improved after medications life-threatening and emergent conditions ruled out.  Stable to follow-up with PCP.  Patient is agreeable to plan.  Will discharge at this time. [TY]    Clinical Course User Index [TY] Coral Spikes, DO                                 Medical Decision Making See ED course for MDM.  Amount and/or Complexity of Data Reviewed External Data Reviewed: notes.    Details: History of antiphospholipid syndrome Labs: ordered. Decision-making details documented in ED Course. Radiology: ordered.  Risk OTC drugs. Prescription drug management. Decision regarding hospitalization. Risk Details: Patient with negative workup.  Unlikely to benefit from acute hospitalization at this time           Final Clinical Impression(s) / ED Diagnoses Final diagnoses:  None    Rx / DC Orders ED Discharge Orders     None         Coral Spikes, DO 12/28/22 1059

## 2022-12-28 NOTE — ED Notes (Signed)
Pt to xray via stretcher

## 2023-05-20 ENCOUNTER — Emergency Department (HOSPITAL_BASED_OUTPATIENT_CLINIC_OR_DEPARTMENT_OTHER)
Admission: EM | Admit: 2023-05-20 | Discharge: 2023-05-20 | Disposition: A | Discharge status: Home / Self Care | Payer: Managed Care, Other (non HMO) | Attending: Emergency Medicine | Admitting: Emergency Medicine

## 2023-05-20 ENCOUNTER — Encounter (HOSPITAL_BASED_OUTPATIENT_CLINIC_OR_DEPARTMENT_OTHER): Payer: Self-pay | Admitting: Emergency Medicine

## 2023-05-20 ENCOUNTER — Emergency Department (HOSPITAL_BASED_OUTPATIENT_CLINIC_OR_DEPARTMENT_OTHER): Payer: Managed Care, Other (non HMO)

## 2023-05-20 ENCOUNTER — Other Ambulatory Visit: Payer: Self-pay

## 2023-05-20 DIAGNOSIS — R109 Unspecified abdominal pain: Secondary | ICD-10-CM | POA: Insufficient documentation

## 2023-05-20 LAB — COMPREHENSIVE METABOLIC PANEL
ALT: 22 U/L (ref 0–44)
AST: 29 U/L (ref 15–41)
Albumin: 4.3 g/dL (ref 3.5–5.0)
Alkaline Phosphatase: 51 U/L (ref 38–126)
Anion gap: 7 (ref 5–15)
BUN: 13 mg/dL (ref 6–20)
CO2: 24 mmol/L (ref 22–32)
Calcium: 9.4 mg/dL (ref 8.9–10.3)
Chloride: 106 mmol/L (ref 98–111)
Creatinine, Ser: 0.78 mg/dL (ref 0.44–1.00)
GFR, Estimated: >60 mL/min (ref >60)
Glucose, Bld: 96 mg/dL (ref 70–99)
Potassium: 3.3 mmol/L — ABNORMAL LOW (ref 3.5–5.1)
Sodium: 137 mmol/L (ref 135–145)
Total Bilirubin: 0.5 mg/dL (ref 0.0–1.2)
Total Protein: 7.1 g/dL (ref 6.5–8.1)

## 2023-05-20 LAB — CBC WITH DIFFERENTIAL/PLATELET
Abs Immature Granulocytes: 0.02 10*3/uL (ref 0.00–0.07)
Basophils Absolute: 0 10*3/uL (ref 0.0–0.1)
Basophils Relative: 1 %
Eosinophils Absolute: 0.3 10*3/uL (ref 0.0–0.5)
Eosinophils Relative: 5 %
HCT: 41.6 % (ref 36.0–46.0)
Hemoglobin: 13.9 g/dL (ref 12.0–15.0)
Immature Granulocytes: 0 %
Lymphocytes Relative: 33 %
Lymphs Abs: 1.9 10*3/uL (ref 0.7–4.0)
MCH: 30.3 pg (ref 26.0–34.0)
MCHC: 33.4 g/dL (ref 30.0–36.0)
MCV: 90.6 fL (ref 80.0–100.0)
Monocytes Absolute: 0.4 10*3/uL (ref 0.1–1.0)
Monocytes Relative: 7 %
Neutro Abs: 3.2 10*3/uL (ref 1.7–7.7)
Neutrophils Relative %: 54 %
Platelets: 319 10*3/uL (ref 150–400)
RBC: 4.59 MIL/uL (ref 3.87–5.11)
RDW: 12.5 % (ref 11.5–15.5)
WBC: 5.8 10*3/uL (ref 4.0–10.5)
nRBC: 0 % (ref 0.0–0.2)

## 2023-05-20 LAB — LIPASE, BLOOD: Lipase: 34 U/L (ref 11–51)

## 2023-05-20 LAB — URINALYSIS, ROUTINE W REFLEX MICROSCOPIC
Bilirubin Urine: NEGATIVE
Glucose, UA: NEGATIVE mg/dL
Hgb urine dipstick: NEGATIVE
Ketones, ur: NEGATIVE mg/dL
Leukocytes,Ua: NEGATIVE
Nitrite: NEGATIVE
Protein, ur: NEGATIVE mg/dL
Specific Gravity, Urine: 1.02 (ref 1.005–1.030)
pH: 5.5 (ref 5.0–8.0)

## 2023-05-20 MED ORDER — FAMOTIDINE 20 MG PO TABS: 20.0000 mg | ORAL_TABLET | Freq: Two times a day (BID) | ORAL | 0 refills | Status: AC

## 2023-05-20 MED ORDER — ACETAMINOPHEN 500 MG PO TABS
1000.0000 mg | ORAL_TABLET | Freq: Once | ORAL | Status: AC
  Administered 2023-05-20: 1000 mg via ORAL
  Filled 2023-05-20: qty 2

## 2023-05-20 MED ORDER — KETOROLAC TROMETHAMINE 15 MG/ML IJ SOLN
15.0000 mg | Freq: Once | INTRAMUSCULAR | Status: DC
  Filled 2023-05-20: qty 1

## 2023-05-20 MED ORDER — SODIUM CHLORIDE 0.9 % IV BOLUS
1000.0000 mL | Freq: Once | INTRAVENOUS | Status: AC
  Administered 2023-05-20: 1000 mL via INTRAVENOUS

## 2023-05-20 MED ORDER — POTASSIUM CHLORIDE CRYS ER 20 MEQ PO TBCR
40.0000 meq | EXTENDED_RELEASE_TABLET | Freq: Once | ORAL | Status: AC
  Administered 2023-05-20: 40 meq via ORAL
  Filled 2023-05-20: qty 2

## 2023-05-20 MED ORDER — ONDANSETRON HCL 4 MG/2ML IJ SOLN
4.0000 mg | Freq: Once | INTRAMUSCULAR | Status: AC
  Administered 2023-05-20: 4 mg via INTRAVENOUS
  Filled 2023-05-20: qty 2

## 2023-05-20 NOTE — ED Notes (Signed)

## 2023-05-20 NOTE — ED Notes (Signed)
 Pt resting lateral R recumbent, advised she does not need any more medicine at this time, when offered.

## 2023-05-20 NOTE — ED Notes (Signed)
 Patient transported to CT

## 2023-05-20 NOTE — ED Triage Notes (Signed)
 Left flank pain radiating around to abdomen x 2 days.  No known fever.  Some nausea, no V/D.  No dysuria.

## 2023-05-20 NOTE — ED Notes (Signed)
Urine specimen in lab 

## 2023-05-20 NOTE — Discharge Instructions (Addendum)
 Your CT scan did not show any signs of kidney stone and your blood work was reassuring.  Your pain could be due to inflammation of your stomach, I am starting you on a medicine called Pepcid .  You should follow-up with your doctor within the next week.  If you develop worsening pain, fever, persistent vomiting or any other new concerning symptoms you should return to the ED.

## 2023-05-20 NOTE — ED Provider Notes (Addendum)
 [place] [facility] Provider Note   CSN: [id number] Arrival date & time: 05/20/23  9170     History  Chief Complaint  Patient presents with   Flank Pain   Abdominal Pain    Evelyn Cervantes is a 43 y.o. female.   Flank Pain Associated symptoms include abdominal pain.  Abdominal Pain Healthy 43 year old presenting for left flank pain.  Symptoms started couple days ago.  She has had intermittent pain to the left flank which radiates around to the left upper quadrant.  Not worse with movement, no clear precipitant.  No change with eating.  Some nausea but no vomiting or diarrhea.  No history of kidney stones.  But a month ago she was treated for UTI with Macrobid.  The symptoms have resolved, no dysuria or lower abdominal pain.  Status post hysterectomy and appendectomy.     Home Medications Prior to Admission medications   Medication Sig Start Date End Date Taking? Authorizing Provider  ondansetron  (ZOFRAN ) 4 MG tablet Take 1 tablet (4 mg total) by mouth every 6 (six) hours. 06/15/21   Autry, Lauren E, PA-C  venlafaxine XR (EFFEXOR-XR) 150 MG 24 hr capsule Take 150 mg by mouth daily. 12/26/19   [provider]      Allergies    Compazine, Demerol, Dilaudid [hydromorphone hcl], and Morphine and codeine    Review of Systems   Review of Systems  Gastrointestinal:  Positive for abdominal pain.  Genitourinary:  Positive for flank pain.  Review of systems completed and notable as per HPI.  ROS otherwise negative.  Physical Exam Updated Vital Signs BP 130/76   Pulse 85   Temp 98.4 F (36.9 C) (Oral)   Resp 18   Ht 5' 8.5 (1.74 m)   Wt 87.1 kg   LMP 10/29/2011   SpO2 100%   BMI 28.77 kg/m  Physical Exam Vitals and nursing note reviewed.  Constitutional:      General: She is not in acute distress.    Appearance: She is well-developed.  HENT:     Head: Normocephalic and atraumatic.     Mouth/Throat:     Mouth: Mucous  membranes are moist.     Pharynx: Oropharynx is clear.  Eyes:     Extraocular Movements: Extraocular movements intact.     Conjunctiva/sclera: Conjunctivae normal.     Pupils: Pupils are equal, round, and reactive to light.  Cardiovascular:     Rate and Rhythm: Normal rate and regular rhythm.     Pulses: Normal pulses.     Heart sounds: Normal heart sounds. No murmur heard. Pulmonary:     Effort: Pulmonary effort is normal. No respiratory distress.     Breath sounds: Normal breath sounds.  Abdominal:     Palpations: Abdomen is soft.     Tenderness: There is no abdominal tenderness. There is no right CVA tenderness, left CVA tenderness, guarding or rebound.  Musculoskeletal:        General: No swelling.     Cervical back: Neck supple.     Right lower leg: No edema.     Left lower leg: No edema.  Skin:    General: Skin is warm and dry.     Capillary Refill: Capillary refill takes less than 2 seconds.  Neurological:     Mental Status: She is alert.  Psychiatric:        Mood and Affect: Mood normal.     ED Results / Procedures / Treatments  Labs (all labs ordered are listed, but only abnormal results are displayed) Labs Reviewed  COMPREHENSIVE METABOLIC PANEL - Abnormal; Notable for the following components:      Result Value   Potassium 3.3 (*)    All other components within normal limits  LIPASE, BLOOD  CBC WITH DIFFERENTIAL/PLATELET  URINALYSIS, ROUTINE W REFLEX MICROSCOPIC    EKG None  Radiology CT Renal Stone Study Result Date: 05/20/2023 CLINICAL DATA:  Abdominal/flank pain, stone suspected L flank pain. EXAM: CT ABDOMEN AND PELVIS WITHOUT CONTRAST TECHNIQUE: Multidetector CT imaging of the abdomen and pelvis was performed following the standard protocol without IV contrast. RADIATION DOSE REDUCTION: This exam was performed according to the departmental dose-optimization program which includes automated exposure control, adjustment of the mA and/or kV according to  patient size and/or use of iterative reconstruction technique. COMPARISON:  CT scan abdomen and pelvis from 06/15/2021. FINDINGS: Lower chest: The lung bases are clear. No pleural effusion. The heart is normal in size. No pericardial effusion. Hepatobiliary: The liver is normal in size. Non-cirrhotic configuration. No suspicious mass. No intrahepatic or extrahepatic bile duct dilation. No calcified gallstones. Normal gallbladder wall thickness. No pericholecystic inflammatory changes. Pancreas: Unremarkable. No pancreatic ductal dilatation or surrounding inflammatory changes. Spleen: Within normal limits. No focal lesion. Adrenals/Urinary Tract: Adrenal glands are unremarkable. No suspicious renal mass. No hydronephrosis. No renal or ureteric calculi. Unremarkable urinary bladder. Stomach/Bowel: No disproportionate dilation of the small or large bowel loops. No evidence of abnormal bowel wall thickening or inflammatory changes. The appendix is surgically absent. Vascular/Lymphatic: No ascites or pneumoperitoneum. No abdominal or pelvic lymphadenopathy, by size criteria. No aneurysmal dilation of the major abdominal arteries. Reproductive: The uterus is surgically absent. No large adnexal mass. Other: There is a tiny fat containing umbilical hernia. The soft tissues and abdominal wall are otherwise unremarkable. Musculoskeletal: No suspicious osseous lesions. There are mild multilevel degenerative changes in the visualized spine. IMPRESSION: 1. No nephroureterolithiasis or obstructive uropathy. 2. No acute inflammatory process identified within the abdomen or pelvis. 3. Multiple other nonacute observations, as described above. Electronically Signed   By: Ree Molt M.D.   On: 05/20/2023 11:14    Procedures Procedures    Medications Ordered in ED Medications  ketorolac  (TORADOL ) 15 MG/ML injection 15 mg (15 mg Intravenous Patient Refused/Not Given 05/20/23 1133)  potassium chloride  SA (KLOR-CON  M) CR  tablet 40 mEq (has no administration in time range)  acetaminophen  (TYLENOL ) tablet 1,000 mg (1,000 mg Oral Given 05/20/23 0937)  sodium chloride  0.9 % bolus 1,000 mL (0 mLs Intravenous Stopped 05/20/23 1041)  ondansetron  (ZOFRAN ) injection 4 mg (4 mg Intravenous Given 05/20/23 9046)    ED Course/ Medical Decision Making/ A&P                                 Medical Decision Making Amount and/or Complexity of Data Reviewed Labs: ordered. Radiology: ordered.  Risk OTC drugs. Prescription drug management.   Medical Decision Making:   Evelyn Cervantes is a 43 y.o. female who presented to the ED today with L flank pain.  Patient is well-appearing, she is a intermittent pain in her left flank was concerning for possible stone.  Consider pyelo, UTI as well as chest with recent UTI.  No fever or signs of sepsis.  Rest of her abdominal exam is benign, she is status post appendectomy.  No tenderness over her gallbladder.  No shortness of breath or chest  pain I do not think this is referred pain from PE or other cardiopulmonary abnormality.   Patient placed on continuous vitals and telemetry monitoring while in ED which was reviewed periodically.  Reviewed and confirmed nursing documentation for past medical history, family history, social history.  Reassessment and Plan:   Labwork overall is reassuring, just mild hypokalemia.  No signs of UTI.  CT stone study does not show any signs of nephrolithiasis.  She is feeling better after Tylenol .  Wonder if she could have some Component of gastritis given her pain is primarily left upper quadrant.  We will trial a course of Pepcid  as an outpatient have her follow-up closely with her PCP.  She is comfortable with this plan.  Discharged in stable condition.    Patient's presentation is most consistent with acute complicated illness / injury requiring diagnostic workup.           Final Clinical Impression(s) / ED Diagnoses Final diagnoses:  Left  flank pain    Rx / DC Orders ED Discharge Orders     None          Nicholaus Cassondra DEL, MD 05/20/23 [phone]

## 2023-05-20 NOTE — ED Notes (Signed)
 Pt advised L flank pain that won't go away. No diarrhea or GI upset. Flank pain radiates around to L pubis. No trauma recently. Warm and dry, NAD.

## 2024-05-22 ENCOUNTER — Emergency Department (HOSPITAL_BASED_OUTPATIENT_CLINIC_OR_DEPARTMENT_OTHER)

## 2024-05-22 ENCOUNTER — Emergency Department (HOSPITAL_BASED_OUTPATIENT_CLINIC_OR_DEPARTMENT_OTHER): Admission: EM | Admit: 2024-05-22 | Discharge: 2024-05-22 | Disposition: A | Discharge status: Home / Self Care

## 2024-05-22 ENCOUNTER — Encounter (HOSPITAL_BASED_OUTPATIENT_CLINIC_OR_DEPARTMENT_OTHER): Payer: Self-pay

## 2024-05-22 ENCOUNTER — Other Ambulatory Visit: Payer: Self-pay

## 2024-05-22 DIAGNOSIS — R079 Chest pain, unspecified: Secondary | ICD-10-CM

## 2024-05-22 DIAGNOSIS — R0789 Other chest pain: Secondary | ICD-10-CM | POA: Insufficient documentation

## 2024-05-22 DIAGNOSIS — R0602 Shortness of breath: Secondary | ICD-10-CM | POA: Diagnosis not present

## 2024-05-22 LAB — CBC
HCT: 41.2 % (ref 36.0–46.0)
Hemoglobin: 13.7 g/dL (ref 12.0–15.0)
MCH: 30.3 pg (ref 26.0–34.0)
MCHC: 33.3 g/dL (ref 30.0–36.0)
MCV: 91.2 fL (ref 80.0–100.0)
Platelets: 348 K/uL (ref 150–400)
RBC: 4.52 MIL/uL (ref 3.87–5.11)
RDW: 12.5 % (ref 11.5–15.5)
WBC: 7.7 K/uL (ref 4.0–10.5)
nRBC: 0 % (ref 0.0–0.2)

## 2024-05-22 LAB — URINALYSIS, ROUTINE W REFLEX MICROSCOPIC
Bilirubin Urine: NEGATIVE
Glucose, UA: NEGATIVE mg/dL
Hgb urine dipstick: NEGATIVE
Ketones, ur: NEGATIVE mg/dL
Leukocytes,Ua: NEGATIVE
Nitrite: NEGATIVE
Protein, ur: NEGATIVE mg/dL
Specific Gravity, Urine: 1.01 (ref 1.005–1.030)
pH: 6 (ref 5.0–8.0)

## 2024-05-22 LAB — TROPONIN T, HIGH SENSITIVITY
Troponin T High Sensitivity: <15 ng/L (ref 0–19)
Troponin T High Sensitivity: <15 ng/L (ref 0–19)

## 2024-05-22 LAB — COMPREHENSIVE METABOLIC PANEL WITH GFR
ALT: 18 U/L (ref 0–44)
AST: 21 U/L (ref 15–41)
Albumin: 4.5 g/dL (ref 3.5–5.0)
Alkaline Phosphatase: 64 U/L (ref 38–126)
Anion gap: 12 (ref 5–15)
BUN: 15 mg/dL (ref 6–20)
CO2: 24 mmol/L (ref 22–32)
Calcium: 9.4 mg/dL (ref 8.9–10.3)
Chloride: 104 mmol/L (ref 98–111)
Creatinine, Ser: 0.79 mg/dL (ref 0.44–1.00)
GFR, Estimated: >60 mL/min
Glucose, Bld: 81 mg/dL (ref 70–99)
Potassium: 4 mmol/L (ref 3.5–5.1)
Sodium: 139 mmol/L (ref 135–145)
Total Bilirubin: 0.2 mg/dL (ref 0.0–1.2)
Total Protein: 6.9 g/dL (ref 6.5–8.1)

## 2024-05-22 LAB — LIPASE, BLOOD: Lipase: 22 U/L (ref 11–51)

## 2024-05-22 MED ORDER — KETOROLAC TROMETHAMINE 15 MG/ML IJ SOLN
15.0000 mg | Freq: Once | INTRAMUSCULAR | Status: AC
  Administered 2024-05-22: 15 mg via INTRAVENOUS
  Filled 2024-05-22: qty 1

## 2024-05-22 MED ORDER — IOHEXOL 350 MG/ML SOLN
100.0000 mL | Freq: Once | INTRAVENOUS | Status: AC | PRN
  Administered 2024-05-22: 100 mL via INTRAVENOUS

## 2024-05-22 MED ORDER — ALUM & MAG HYDROXIDE-SIMETH 200-200-20 MG/5ML PO SUSP
30.0000 mL | Freq: Once | ORAL | Status: AC
  Administered 2024-05-22: 30 mL via ORAL
  Filled 2024-05-22: qty 30

## 2024-05-22 NOTE — ED Provider Notes (Signed)
 " [place] [facility] Provider Note   CSN: [id number] Arrival date & time: 05/22/24  9197     Patient presents with: Chest Pain   Evelyn Cervantes is a 44 y.o. female.   This is a 44 year old female presenting emergency department for chest pain.  Reports recent pneumonia was treated.  Awoke this morning with central chest pain described as pressure.  Radiating straight through to her back.  Reports some mild shortness of breath but that has been ongoing since diagnosed with pneumonia and unchanged.  No nausea vomiting diarrhea.    Chest Pain      Prior to Admission medications  Medication Sig Start Date End Date Taking? Authorizing Provider  albuterol (VENTOLIN HFA) 108 (90 Base) MCG/ACT inhaler Inhale 2 puffs into the lungs every 6 (six) hours as needed. 05/16/24  Yes [provider]  famotidine  (PEPCID ) 20 MG tablet Take 1 tablet (20 mg total) by mouth 2 (two) times daily. 05/20/23   Davis, Jonathon H, MD  ondansetron  (ZOFRAN ) 4 MG tablet Take 1 tablet (4 mg total) by mouth every 6 (six) hours. 06/15/21   Autry, Lauren E, PA-C  venlafaxine XR (EFFEXOR-XR) 150 MG 24 hr capsule Take 150 mg by mouth daily. 12/26/19   [provider]    Allergies: Compazine, Demerol, Dilaudid [hydromorphone hcl], and Morphine and codeine    Review of Systems  Cardiovascular:  Positive for chest pain.    Updated Vital Signs BP (!) 140/87 (BP Location: Right Arm)   Pulse 66   Temp 98.7 F (37.1 C) (Oral)   Resp 16   Ht 5' 8 (1.727 m)   Wt 83.9 kg   LMP 10/29/2011   SpO2 100%   BMI 28.13 kg/m   Physical Exam Vitals and nursing note reviewed.  Constitutional:      General: She is not in acute distress.    Appearance: She is not toxic-appearing.  Cardiovascular:     Rate and Rhythm: Normal rate and regular rhythm.     Pulses:          Radial pulses are 2+ on the right side and 2+ on the left side.       Dorsalis pedis pulses are 2+  on the right side and 2+ on the left side.     Heart sounds: Normal heart sounds.  Pulmonary:     Effort: Pulmonary effort is normal.     Breath sounds: Normal breath sounds.  Musculoskeletal:     Right lower leg: No edema.     Left lower leg: No edema.  Skin:    General: Skin is warm.     Capillary Refill: Capillary refill takes less than 2 seconds.  Neurological:     General: No focal deficit present.     Mental Status: She is alert.  Psychiatric:        Mood and Affect: Mood normal.        Behavior: Behavior normal.     (all labs ordered are listed, but only abnormal results are displayed) Labs Reviewed  CBC  URINALYSIS, ROUTINE W REFLEX MICROSCOPIC  COMPREHENSIVE METABOLIC PANEL WITH GFR  LIPASE, BLOOD  TROPONIN T, HIGH SENSITIVITY  TROPONIN T, HIGH SENSITIVITY    EKG: EKG Interpretation Date/Time:  Monday May 22 2024 08:17:52 EST Ventricular Rate:  86 PR Interval:  116 QRS Duration:  124 QT Interval:  390 QTC Calculation: 467 R Axis:   87  Text Interpretation: Sinus rhythm Borderline  short PR interval Right bundle branch block Confirmed by Neysa Clap [id number]) on 05/22/2024 9:29:53 AM  Radiology: CT Angio Chest Aorta W and/or Wo Contrast Result Date: 05/22/2024 CLINICAL DATA:  Chest pressure. EXAM: CT ANGIOGRAPHY CHEST WITH CONTRAST TECHNIQUE: Multidetector CT imaging of the chest was performed using the standard protocol during bolus administration of intravenous contrast. Multiplanar CT image reconstructions and MIPs were obtained to evaluate the vascular anatomy. RADIATION DOSE REDUCTION: This exam was performed according to the departmental dose-optimization program which includes automated exposure control, adjustment of the mA and/or kV according to patient size and/or use of iterative reconstruction technique. CONTRAST:  OMNIPAQUE  IOHEXOL  350 MG/ML SOLN COMPARISON:  None Available. FINDINGS: Cardiovascular: Satisfactory opacification of the pulmonary  arteries to the segmental level. No evidence of pulmonary embolism. Normal heart size. No pericardial effusion. Mediastinum/Nodes: No enlarged mediastinal, hilar, or axillary lymph nodes. Thyroid  gland, trachea, and esophagus demonstrate no significant findings. Lungs/Pleura: Lungs are clear. No pleural effusion or pneumothorax. Upper Abdomen: No acute abnormality. Musculoskeletal: There is moderate severity dextroscoliosis of the midthoracic spine. No acute osseous abnormalities are identified. Review of the MIP images confirms the above findings. IMPRESSION: 1. No evidence of pulmonary embolism or other acute intrathoracic process. 2. Moderate severity dextroscoliosis of the midthoracic spine. Electronically Signed   By: Suzen Dials M.D.   On: 05/22/2024 11:10   DG Chest Portable 1 View Result Date: 05/22/2024 CLINICAL DATA:  Chest pain.  Shortness of breath. EXAM: PORTABLE CHEST 1 VIEW COMPARISON:  12/28/2022 FINDINGS: The lungs are clear without focal pneumonia, edema, pneumothorax or pleural effusion. The cardiopericardial silhouette is within normal limits for size. No acute bony abnormality. Convex rightward thoracic scoliosis evident. IMPRESSION: No active disease. Electronically Signed   By: Camellia Candle M.D.   On: 05/22/2024 08:42     Procedures   Medications Ordered in the ED  alum & mag hydroxide-simeth (MAALOX/MYLANTA) 200-200-20 MG/5ML suspension 30 mL (30 mLs Oral Given 05/22/24 0849)  iohexol  (OMNIPAQUE ) 350 MG/ML injection 100 mL (100 mLs Intravenous Contrast Given 05/22/24 0959)  ketorolac  (TORADOL ) 15 MG/ML injection 15 mg (15 mg Intravenous Given 05/22/24 1142)    Clinical Course as of 05/22/24 1500  Mon May 22, 2024  0913 Urinalysis, Routine w reflex microscopic -Urine, Clean Catch No evidence of UTI [TY]  0913 CBC No leukocytosis.  No anemia [TY]  1013 CT Angio Chest Aorta W and/or Wo Contrast On my review of imaging.  I Do not appreciate obvious pneumothorax or aortic  dissection.  No large saddle PE [TY]  1051 Troponin T High Sensitivity: <15 [TY]  1051 Lipase: 22 [TY]  1051 Comprehensive metabolic panel No metabolic derangements.  Normal kidney function.  No transaminitis to suggest acute hepatobiliary disease. [TY]  1118 CT Angio Chest Aorta W and/or Wo Contrast IMPRESSION: 1. No evidence of pulmonary embolism or other acute intrathoracic process. 2. Moderate severity dextroscoliosis of the midthoracic spine.   Electronically Signed   By: Suzen Dials M.D.   On: 05/22/2024 11:10   [TY]  1155 Patient is feeling improved after medications.  With negative troponin x 2, low heart score, negative CT imaging with improvement of symptoms feel that she is appropriate for further workup outpatient.  No emergent condition identified.  Will discharge in stable condition. [TY]    Clinical Course User Index [TY] Neysa Clap PARAS, DO  Medical Decision Making Is a 44 year old female presenting emergency department with chest pain.  She is afebrile nontachycardic, slightly hypertensive to 140/83, maintaining oxygen saturation on room air.  Does not appear to be in overt distress.  Her EKG appears to have a right bundle branch pattern, but otherwise normal intervals.  No ST segment changes to indicate ischemia.  No QTc prolongation.  G Will get cardiac screening labs as well.  Given description concern for possible dissection, although overall lower clinical suspicion given vitals and physical exam. Will get CTA to evaluate. See ED course for further MDM/disposition.    Amount and/or Complexity of Data Reviewed External Data Reviewed:     Details: No prior cardiac testing.   Labs: ordered. Decision-making details documented in ED Course. Radiology: ordered and independent interpretation performed. Decision-making details documented in ED Course. ECG/medicine tests: independent interpretation performed.  Risk OTC  drugs. Prescription drug management. Decision regarding hospitalization.       Final diagnoses:  Chest pain, unspecified type    ED Discharge Orders     None          Neysa Caron PARAS, DO 05/22/24 1500  "

## 2024-05-22 NOTE — ED Triage Notes (Signed)
 Pt says she was treated for pneumonia last week, some sob, newly having chest pain starting 0500 this morning. States it woke her up and is a pressure in her entire chest and to the center of her back. Denies n/v/diarrhea/dizziness. No radiation to jaw or arm.

## 2024-05-22 NOTE — Discharge Instructions (Addendum)
 Please follow-up with your primary doctor you may also follow-up with cardiology.  May try over-the-counter medication such as Tylenol  and ibuprofen  for continued pain.  May also try Protonix in case there is a reflux component.  Return for fevers, chills, difficulty breathing, lightheadedness, passout, palpitations uncontrolled nausea vomiting, chest pain worsens or changes or you develop any new or worsening symptoms that are concerning to you.
